# Patient Record
Sex: Male | Born: 1990 | Race: Black or African American | Hispanic: No | Marital: Single | Smoking: Never smoker
Health system: Southern US, Community
[De-identification: ages and names within clinical notes are randomized; demographics above are authoritative.]

## PROBLEM LIST (undated history)

## (undated) HISTORY — PX: NO PAST SURGERIES: SHX2092

---
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
## 2011-09-15 ENCOUNTER — Emergency Department: Payer: Self-pay | Admitting: Emergency Medicine

## 2014-09-22 ENCOUNTER — Emergency Department
Admission: EM | Admit: 2014-09-22 | Discharge: 2014-09-22 | Disposition: A | Discharge status: Home / Self Care | Payer: Self-pay | Attending: Emergency Medicine | Admitting: Emergency Medicine

## 2014-09-22 ENCOUNTER — Encounter: Payer: Self-pay | Admitting: Emergency Medicine

## 2014-09-22 DIAGNOSIS — J351 Hypertrophy of tonsils: Secondary | ICD-10-CM

## 2014-09-22 DIAGNOSIS — J358 Other chronic diseases of tonsils and adenoids: Secondary | ICD-10-CM | POA: Insufficient documentation

## 2014-09-22 LAB — POCT RAPID STREP A: STREPTOCOCCUS, GROUP A SCREEN (DIRECT): NEGATIVE

## 2014-09-22 NOTE — ED Notes (Signed)
Pt reports sore throat x3 days, reports swelling to tonsils, reports difficulty talking; airway intact.

## 2014-09-22 NOTE — ED Provider Notes (Signed)
[facility] Emergency Department Provider Note  ____________________________________________  Time seen: On arrival  I have reviewed the triage vital signs and the nursing notes.   HISTORY  Chief Complaint Sore Throat    HPI Eduardo Edwards is a 24 y.o. male who presents with a discomfort in the back of his throat that occurs intermittently over the last couple of months. He denies a history of acid reflux. He denies a history of difficulty swallowing or difficult breathing. He reports it feels like something is pressing on the back of his throat occasionally and he has to clear his throat. Mother reports patient's family have all had had her tonsils removed and she believes that is what causing his symptoms. He denies a sore throat.    History reviewed. No pertinent past medical history.  There are no active problems to display for this patient.   History reviewed. No pertinent past surgical history.  No current outpatient prescriptions on file.  Allergies Review of patient's allergies indicates no known allergies.  No family history on file.  Social History History  Substance Use Topics  . Smoking status: Never Smoker   . Smokeless tobacco: Not on file  . Alcohol Use: No    Review of Systems  Constitutional: Negative for fever. Eyes: Negative for visual changes. ENT: Negative for sore throat   Genitourinary: Negative for dysuria. Musculoskeletal: Negative for back pain. Skin: Negative for rash. Neurological: Negative for headaches or focal weakness   ____________________________________________   PHYSICAL EXAM:  VITAL SIGNS: ED Triage Vitals  Enc Vitals Group     BP 09/22/14 0958 134/84 mmHg     Pulse Rate 09/22/14 0958 68     Resp 09/22/14 0958 16     Temp 09/22/14 0958 98.4 F (36.9 C)     Temp Source 09/22/14 0958 Oral     SpO2 09/22/14 0958 97 %     Weight 09/22/14 0958 220 lb (99.791 kg)     Height 09/22/14 0958 5\' 10"   (1.778 m)     Head Cir --      Peak Flow --      Pain Score 09/22/14 0958 10     Pain Loc --      Pain Edu? --      Excl. in GC? --      Constitutional: Alert and oriented. Well appearing and in no distress. Eyes: Conjunctivae are normal.  ENT   Head: Normocephalic and atraumatic.   Mouth/Throat: Mucous membranes are moist. Patient does have large tonsils bilaterally but no evidence of infection. Uvula is normal. No erythema, no stridor Cardiovascular: Normal rate, regular rhythm.  Respiratory: Normal respiratory effort without tachypnea nor retractions.  Gastrointestinal: Soft and non-tender in all quadrants. No distention. There is no CVA tenderness. Musculoskeletal: Nontender with normal range of motion in all extremities. Neurologic:  Normal speech and language. No gross focal neurologic deficits are appreciated. Skin:  Skin is warm, dry and intact. No rash noted. Psychiatric: Mood and affect are normal. Patient exhibits appropriate insight and judgment.  ____________________________________________    LABS (pertinent positives/negatives)  Labs Reviewed  CULTURE, GROUP A STREP ([facility] ONLY)  POCT RAPID STREP A    ____________________________________________     ____________________________________________    RADIOLOGY I have personally reviewed any xrays that were ordered on this patient: None  ____________________________________________   PROCEDURES  Procedure(s) performed: none   ____________________________________________   INITIAL IMPRESSION / ASSESSMENT AND PLAN / ED COURSE  Pertinent labs &  imaging results that were available during my care of the patient were reviewed by me and considered in my medical decision making (see chart for details).  Unclear cause of patient's symptoms, suspect enlarged tonsils versus gastric reflux. Refer to ENT for further evaluation. Patient no acute  distress  ____________________________________________   FINAL CLINICAL IMPRESSION(S) / ED DIAGNOSES  Final diagnoses:  Swollen tonsil     Jene Every, MD 09/22/14 1122

## 2014-09-24 LAB — CULTURE, GROUP A STREP (THRC)

## 2015-08-30 ENCOUNTER — Emergency Department
Admission: EM | Admit: 2015-08-30 | Discharge: 2015-08-30 | Disposition: A | Discharge status: Home / Self Care | Payer: Self-pay | Attending: Emergency Medicine | Admitting: Emergency Medicine

## 2015-08-30 ENCOUNTER — Encounter: Payer: Self-pay | Admitting: Emergency Medicine

## 2015-08-30 DIAGNOSIS — L237 Allergic contact dermatitis due to plants, except food: Secondary | ICD-10-CM | POA: Insufficient documentation

## 2015-08-30 MED ORDER — PREDNISONE 10 MG (21) PO TBPK: ORAL_TABLET | ORAL | Status: DC

## 2015-08-30 MED ORDER — HYDROXYZINE HCL 25 MG PO TABS: 25.0000 mg | ORAL_TABLET | Freq: Three times a day (TID) | ORAL | Status: DC | PRN

## 2015-08-30 NOTE — Discharge Instructions (Signed)
Contact Dermatitis Dermatitis is redness, soreness, and swelling (inflammation) of the skin. Contact dermatitis is a reaction to certain substances that touch the skin. There are two types of contact dermatitis:   Irritant contact dermatitis. This type is caused by something that irritates your skin, such as dry hands from washing them too much. This type does not require previous exposure to the substance for a reaction to occur. This type is more common.  Allergic contact dermatitis. This type is caused by a substance that you are allergic to, such as a nickel allergy or poison ivy. This type only occurs if you have been exposed to the substance (allergen) before. Upon a repeat exposure, your body reacts to the substance. This type is less common. CAUSES  Many different substances can cause contact dermatitis. Irritant contact dermatitis is most commonly caused by exposure to:   Makeup.   Soaps.   Detergents.   Bleaches.   Acids.   Metal salts, such as nickel.  Allergic contact dermatitis is most commonly caused by exposure to:   Poisonous plants.   Chemicals.   Jewelry.   Latex.   Medicines.   Preservatives in products, such as clothing.  RISK FACTORS This condition is more likely to develop in:   People who have jobs that expose them to irritants or allergens.  People who have certain medical conditions, such as asthma or eczema.  SYMPTOMS  Symptoms of this condition may occur anywhere on your body where the irritant has touched you or is touched by you. Symptoms include:  Dryness or flaking.   Redness.   Cracks.   Itching.   Pain or a burning feeling.   Blisters.  Drainage of small amounts of blood or clear fluid from skin cracks. With allergic contact dermatitis, there may also be swelling in areas such as the eyelids, mouth, or genitals.  DIAGNOSIS  This condition is diagnosed with a medical history and physical exam. A patch skin test  may be performed to help determine the cause. If the condition is related to your job, you may need to see an occupational medicine specialist. TREATMENT Treatment for this condition includes figuring out what caused the reaction and protecting your skin from further contact. Treatment may also include:   Steroid creams or ointments. Oral steroid medicines may be needed in more severe cases.  Antibiotics or antibacterial ointments, if a skin infection is present.  Antihistamine lotion or an antihistamine taken by mouth to ease itching.  A bandage (dressing). HOME CARE INSTRUCTIONS Skin Care  Moisturize your skin as needed.   Apply cool compresses to the affected areas.  Try taking a bath with:  Epsom salts. Follow the instructions on the packaging. You can get these at your local pharmacy or grocery store.  Baking soda. Pour a small amount into the bath as directed by your health care provider.  Colloidal oatmeal. Follow the instructions on the packaging. You can get this at your local pharmacy or grocery store.  Try applying baking soda paste to your skin. Stir water into baking soda until it reaches a paste-like consistency.  Do not scratch your skin.  Bathe less frequently, such as every other day.  Bathe in lukewarm water. Avoid using hot water. Medicines  Take or apply over-the-counter and prescription medicines only as told by your health care provider.   If you were prescribed an antibiotic medicine, take or apply your antibiotic as told by your health care provider. Do not stop using the   antibiotic even if your condition starts to improve. General Instructions  Keep all follow-up visits as told by your health care provider. This is important.  Avoid the substance that caused your reaction. If you do not know what caused it, keep a journal to try to track what caused it. Write down:  What you eat.  What cosmetic products you use.  What you drink.  What  you wear in the affected area. This includes jewelry.  If you were given a dressing, take care of it as told by your health care provider. This includes when to change and remove it. SEEK MEDICAL CARE IF:   Your condition does not improve with treatment.  Your condition gets worse.  You have signs of infection such as swelling, tenderness, redness, soreness, or warmth in the affected area.  You have a fever.  You have new symptoms. SEEK IMMEDIATE MEDICAL CARE IF:   You have a severe headache, neck pain, or neck stiffness.  You vomit.  You feel very sleepy.  You notice red streaks coming from the affected area.  Your bone or joint underneath the affected area becomes painful after the skin has healed.  The affected area turns darker.  You have difficulty breathing.   This information is not intended to replace advice given to you by your health care provider. Make sure you discuss any questions you have with your health care provider.   Document Released: 02/22/2000 Document Revised: 11/15/2014 Document Reviewed: 07/12/2014 Elsevier Interactive Patient Education 2016 [employer]  

## 2015-08-30 NOTE — ED Notes (Signed)
States he was helping clean out in yard last Sunday  Now has poison oak to arms and face

## 2015-08-30 NOTE — ED Provider Notes (Signed)
[facility] Emergency Department Provider Note  ____________________________________________  Time seen: Approximately 3:19 PM  I have reviewed the triage vital signs and the nursing notes.   HISTORY  Chief Complaint Poison Ivy   HPI Eduardo Edwards is a 25 y.o. male who presents to the emergency department for evaluation of a rash. While helping his dad cut some trees and work in the yard last Sunday, he feels he may have come into contact with poison ivy/oak. He awakened on Monday with itchy, watery rash that has now spread to his face and both arms. No relief with OTC topical medications.  History reviewed. No pertinent past medical history.  There are no active problems to display for this patient.   History reviewed. No pertinent past surgical history.  Current Outpatient Rx  Name  Route  Sig  Dispense  Refill  . hydrOXYzine (ATARAX/VISTARIL) 25 MG tablet   Oral   Take 1 tablet (25 mg total) by mouth 3 (three) times daily as needed.   30 tablet   0   . predniSONE (STERAPRED UNI-PAK 21 TAB) 10 MG (21) TBPK tablet      Take 6 tablets on day 1 Take 5 tablets on day 2 Take 4 tablets on day 3 Take 3 tablets on day 4 Take 2 tablets on day 5 Take 1 tablet on day 6   21 tablet   0     Allergies Review of patient's allergies indicates no known allergies.  No family history on file.  Social History Social History  Substance Use Topics  . Smoking status: Never Smoker   . Smokeless tobacco: None  . Alcohol Use: No    Review of Systems  Constitutional: Negative for fever/chills Respiratory: Negative for shortness of breath. Musculoskeletal: Negative for pain. Skin: Positive for rash. Neurological: Negative for headaches, focal weakness or numbness. ____________________________________________   PHYSICAL EXAM:  VITAL SIGNS: ED Triage Vitals  Enc Vitals Group     BP 08/30/15 1414 127/80 mmHg     Pulse Rate 08/30/15 1414 81     Resp  08/30/15 1414 17     Temp 08/30/15 1414 98.9 F (37.2 C)     Temp src --      SpO2 08/30/15 1414 99 %     Weight 08/30/15 1414 240 lb (108.863 kg)     Height 08/30/15 1414 5\' 10"  (1.778 m)     Head Cir --      Peak Flow --      Pain Score --      Pain Loc --      Pain Edu? --      Excl. in GC? --      Constitutional: Alert and oriented. Well appearing and in no acute distress. Eyes: Conjunctivae are normal. EOMI. Nose: No congestion/rhinnorhea. Mouth/Throat: Mucous membranes are moist.   Neck: No stridor. Lymphatic: No cervical lymphadenopathy. Cardiovascular: Good peripheral circulation. Respiratory: Normal respiratory effort.  No retractions. Lungs clear to auscultation. Musculoskeletal: FROM throughout. Neurologic:  Normal speech and language. No gross focal neurologic deficits are appreciated. Skin:  Linear, vesicular rash consistent with poison ivy exposure on bilateral forearms and forehead.  ____________________________________________   LABS (all labs ordered are listed, but only abnormal results are displayed)  Labs Reviewed - No data to display ____________________________________________  EKG   ____________________________________________  RADIOLOGY   ____________________________________________   PROCEDURES  Procedure(s) performed:  ____________________________________________   INITIAL IMPRESSION / ASSESSMENT AND PLAN / ED COURSE  Pertinent  labs & imaging results that were available during my care of the patient were reviewed by me and considered in my medical decision making (see chart for details).  He will be advised to take the prednisone taper until finished and the hydroxyzine as prescribed.  He was advised to follow up with PCP of his choice as needed.  He was also advised to return to the emergency department for symptoms that change or worsen if unable to schedule an  appointment.  ____________________________________________   FINAL CLINICAL IMPRESSION(S) / ED DIAGNOSES  Final diagnoses:  Contact dermatitis due to poison ivy    Discharge Medication List as of 08/30/2015  2:40 PM    START taking these medications   Details  hydrOXYzine (ATARAX/VISTARIL) 25 MG tablet Take 1 tablet (25 mg total) by mouth 3 (three) times daily as needed., Starting 08/30/2015, Until Discontinued, Print    predniSONE (STERAPRED UNI-PAK 21 TAB) 10 MG (21) TBPK tablet Take 6 tablets on day 1 Take 5 tablets on day 2 Take 4 tablets on day 3 Take 3 tablets on day 4 Take 2 tablets on day 5 Take 1 tablet on day 6, Print         Chinita PesterCari B Naftuli Dalsanto, FNP 08/30/15 1522  Minna AntisKevin Paduchowski, MD 08/30/15 2006

## 2016-02-21 ENCOUNTER — Emergency Department: Payer: Self-pay

## 2016-02-21 ENCOUNTER — Encounter: Payer: Self-pay | Admitting: Emergency Medicine

## 2016-02-21 ENCOUNTER — Emergency Department
Admission: EM | Admit: 2016-02-21 | Discharge: 2016-02-21 | Disposition: A | Discharge status: Home / Self Care | Payer: Self-pay | Attending: Emergency Medicine | Admitting: Emergency Medicine

## 2016-02-21 DIAGNOSIS — Z79899 Other long term (current) drug therapy: Secondary | ICD-10-CM | POA: Insufficient documentation

## 2016-02-21 DIAGNOSIS — R42 Dizziness and giddiness: Secondary | ICD-10-CM

## 2016-02-21 LAB — URINALYSIS, COMPLETE (UACMP) WITH MICROSCOPIC
Bacteria, UA: NONE SEEN
Bilirubin Urine: NEGATIVE
Glucose, UA: NEGATIVE mg/dL
Ketones, ur: NEGATIVE mg/dL
Leukocytes, UA: NEGATIVE
Nitrite: NEGATIVE
PROTEIN: NEGATIVE mg/dL
SQUAMOUS EPITHELIAL / LPF: NONE SEEN
Specific Gravity, Urine: 1.028 (ref 1.005–1.030)
pH: 5 (ref 5.0–8.0)

## 2016-02-21 LAB — BASIC METABOLIC PANEL
Anion gap: 6 (ref 5–15)
BUN: 13 mg/dL (ref 6–20)
CO2: 28 mmol/L (ref 22–32)
CREATININE: 1.08 mg/dL (ref 0.61–1.24)
Calcium: 9.3 mg/dL (ref 8.9–10.3)
Chloride: 105 mmol/L (ref 101–111)
GFR calc Af Amer: >60 mL/min (ref >60)
GLUCOSE: 123 mg/dL — AB (ref 65–99)
POTASSIUM: 3.6 mmol/L (ref 3.5–5.1)
Sodium: 139 mmol/L (ref 135–145)

## 2016-02-21 LAB — CBC
HCT: 39.1 % — ABNORMAL LOW (ref 40.0–52.0)
Hemoglobin: 13.8 g/dL (ref 13.0–18.0)
MCH: 29.6 pg (ref 26.0–34.0)
MCHC: 35.2 g/dL (ref 32.0–36.0)
MCV: 84.1 fL (ref 80.0–100.0)
PLATELETS: 310 10*3/uL (ref 150–440)
RBC: 4.65 MIL/uL (ref 4.40–5.90)
RDW: 14.1 % (ref 11.5–14.5)
WBC: 9.1 10*3/uL (ref 3.8–10.6)

## 2016-02-21 LAB — TROPONIN I

## 2016-02-21 MED ORDER — MECLIZINE HCL 32 MG PO TABS: 32.0000 mg | ORAL_TABLET | Freq: Three times a day (TID) | ORAL | 0 refills | Status: DC | PRN

## 2016-02-21 NOTE — ED Provider Notes (Signed)
[facility] Emergency Department Provider Note    First MD Initiated Contact with Patient 02/21/16 0423     (approximate)  I have reviewed the triage vital signs and the nursing notes.   HISTORY  Chief Complaint Dizziness   HPI Eduardo Edwards is a 25 y.o. male presents with intermittent dizziness 3 days with no associated symptoms. Patient denies any chest pain no shortness of breath or  headache. Patient denies any aggravating or alleviating factors for episodes of dizziness. Patient denies any recent illness no congestion or cough. Patient denies any nausea vomiting or diarrhea recently.   Past medical history None There are no active problems to display for this patient.   Past surgical history None  Prior to Admission medications   Medication Sig Start Date End Date Taking? Authorizing Provider  hydrOXYzine (ATARAX/VISTARIL) 25 MG tablet Take 1 tablet (25 mg total) by mouth 3 (three) times daily as needed. 08/30/15   Chinita Pesterari B Triplett, FNP  meclizine (ANTIVERT) 32 MG tablet Take 1 tablet (32 mg total) by mouth 3 (three) times daily as needed. 02/21/16   Darci Currentandolph N Quiros, MD  predniSONE (STERAPRED UNI-PAK 21 TAB) 10 MG (21) TBPK tablet Take 6 tablets on day 1 Take 5 tablets on day 2 Take 4 tablets on day 3 Take 3 tablets on day 4 Take 2 tablets on day 5 Take 1 tablet on day 6 08/30/15   Chinita Pesterari B Triplett, FNP    Allergies Patient has no known allergies.  No family history on file.  Social History Social History  Substance Use Topics  . Smoking status: Never Smoker  . Smokeless tobacco: Never Used  . Alcohol use No    Review of Systems Constitutional: No fever/chills Eyes: No visual changes. ENT: No sore throat. Cardiovascular: Denies chest pain. Respiratory: Denies shortness of breath. Gastrointestinal: No abdominal pain.  No nausea, no vomiting.  No diarrhea.  No constipation. Genitourinary: Negative for dysuria. Musculoskeletal:  Negative for back pain. Skin: Negative for rash. Neurological: Negative for headaches, focal weakness or numbness.Positive for dizziness  10-point ROS otherwise negative.  ____________________________________________   PHYSICAL EXAM:  VITAL SIGNS: ED Triage Vitals [02/21/16 0104]  Enc Vitals Group     BP 120/75     Pulse Rate 74     Resp 18     Temp 98 F (36.7 C)     Temp Source Oral     SpO2 97 %     Weight 230 lb (104.3 kg)     Height 5\' 10"  (1.778 m)     Head Circumference      Peak Flow      Pain Score      Pain Loc      Pain Edu?      Excl. in GC?     Constitutional: Alert and oriented. Well appearing and in no acute distress. Eyes: Conjunctivae are normal. PERRL. EOMI. Head: Atraumatic. Ears:  Healthy appearing ear canals and TMs bilaterally Nose: No congestion/rhinnorhea. Mouth/Throat: Mucous membranes are moist.  Oropharynx non-erythematous. Neck: No stridor.  No meningeal signs.  Cardiovascular: Normal rate, regular rhythm. Good peripheral circulation. Grossly normal heart sounds. Respiratory: Normal respiratory effort.  No retractions. Lungs CTAB. Gastrointestinal: Soft and nontender. No distention.  Musculoskeletal: No lower extremity tenderness nor edema. No gross deformities of extremities. Neurologic:  Normal speech and language. No gross focal neurologic deficits are appreciated.  Skin:  Skin is warm, dry and intact. No rash noted. Psychiatric: Mood and  affect are normal. Speech and behavior are normal.  ____________________________________________   LABS (all labs ordered are listed, but only abnormal results are displayed)  Labs Reviewed  BASIC METABOLIC PANEL - Abnormal; Notable for the following:       Result Value   Glucose, Bld 123 (*)    All other components within normal limits  CBC - Abnormal; Notable for the following:    HCT 39.1 (*)    All other components within normal limits  URINALYSIS, COMPLETE (UACMP) WITH MICROSCOPIC -  Abnormal; Notable for the following:    Color, Urine YELLOW (*)    APPearance CLEAR (*)    Hgb urine dipstick MODERATE (*)    All other components within normal limits  TROPONIN I   ____________________________________________  EKG  ED ECG REPORT I, [place] N Age, the attending physician, personally viewed and interpreted this ECG.   Date: 02/21/2016  EKG Time: 1:08 AM  Rate: 76  Rhythm: Normal sinus rhythm  Axis: Normal  Intervals: Normal  ST&T Change: None  ____________________________________________  RADIOLOGY I, [place] N Aho, personally viewed and evaluated these images (plain radiographs) as part of my medical decision making, as well as reviewing the written report by the radiologist.  Ct Head Wo Contrast  Result Date: 02/21/2016 CLINICAL DATA:  25 year old male with dizziness. EXAM: CT HEAD WITHOUT CONTRAST TECHNIQUE: Contiguous axial images were obtained from the base of the skull through the vertex without intravenous contrast. COMPARISON:  None. FINDINGS: Brain: No evidence of acute infarction, hemorrhage, hydrocephalus, extra-axial collection or mass lesion/mass effect. Vascular: No hyperdense vessel or unexpected calcification. Skull: Normal. Negative for fracture or focal lesion. Sinuses/Orbits: No acute finding. Other: None. IMPRESSION: No acute intracranial pathology. Electronically Signed   By: Elgie CollardArash  Radparvar M.D.   On: 02/21/2016 05:20     Procedures    INITIAL IMPRESSION / ASSESSMENT AND PLAN / ED COURSE  Pertinent labs & imaging results that were available during my care of the patient were reviewed by me and considered in my medical decision making (see chart for details).     Clinical Course     ____________________________________________  FINAL CLINICAL IMPRESSION(S) / ED DIAGNOSES  Final diagnoses:  Vertigo     MEDICATIONS GIVEN DURING THIS VISIT:  Medications - No data to display   NEW OUTPATIENT MEDICATIONS STARTED  DURING THIS VISIT:  New Prescriptions   MECLIZINE (ANTIVERT) 32 MG TABLET    Take 1 tablet (32 mg total) by mouth 3 (three) times daily as needed.    Modified Medications   No medications on file    Discontinued Medications   No medications on file     Note:  This document was prepared using Dragon voice recognition software and may include unintentional dictation errors.    Darci Currentandolph N Bagby, MD 02/21/16 [phone]

## 2016-02-21 NOTE — ED Triage Notes (Signed)
Patient ambulatory to triage with steady gait, without difficulty or distress noted; pt reports dizziness last couple days with no accomp symptoms; denies any recent illness

## 2016-07-09 ENCOUNTER — Encounter: Payer: Self-pay | Admitting: Emergency Medicine

## 2016-07-09 ENCOUNTER — Emergency Department
Admission: EM | Admit: 2016-07-09 | Discharge: 2016-07-09 | Disposition: A | Discharge status: Home / Self Care | Payer: Self-pay | Attending: Emergency Medicine | Admitting: Emergency Medicine

## 2016-07-09 DIAGNOSIS — R42 Dizziness and giddiness: Secondary | ICD-10-CM | POA: Insufficient documentation

## 2016-07-09 DIAGNOSIS — Z79899 Other long term (current) drug therapy: Secondary | ICD-10-CM | POA: Insufficient documentation

## 2016-07-09 LAB — CBC
HCT: 39.2 % — ABNORMAL LOW (ref 40.0–52.0)
Hemoglobin: 13.5 g/dL (ref 13.0–18.0)
MCH: 28.8 pg (ref 26.0–34.0)
MCHC: 34.3 g/dL (ref 32.0–36.0)
MCV: 83.8 fL (ref 80.0–100.0)
PLATELETS: 311 10*3/uL (ref 150–440)
RBC: 4.67 MIL/uL (ref 4.40–5.90)
RDW: 14.5 % (ref 11.5–14.5)
WBC: 7 10*3/uL (ref 3.8–10.6)

## 2016-07-09 LAB — URINALYSIS, COMPLETE (UACMP) WITH MICROSCOPIC
Bilirubin Urine: NEGATIVE
Glucose, UA: NEGATIVE mg/dL
Hgb urine dipstick: NEGATIVE
KETONES UR: NEGATIVE mg/dL
Leukocytes, UA: NEGATIVE
Nitrite: NEGATIVE
Protein, ur: NEGATIVE mg/dL
Specific Gravity, Urine: 1.024 (ref 1.005–1.030)
WBC UA: NONE SEEN WBC/hpf (ref 0–5)
pH: 5 (ref 5.0–8.0)

## 2016-07-09 LAB — BASIC METABOLIC PANEL
Anion gap: 7 (ref 5–15)
BUN: 15 mg/dL (ref 6–20)
CALCIUM: 9.1 mg/dL (ref 8.9–10.3)
CO2: 25 mmol/L (ref 22–32)
CREATININE: 1.1 mg/dL (ref 0.61–1.24)
Chloride: 109 mmol/L (ref 101–111)
GFR calc non Af Amer: >60 mL/min (ref >60)
Glucose, Bld: 101 mg/dL — ABNORMAL HIGH (ref 65–99)
Potassium: 3.9 mmol/L (ref 3.5–5.1)
Sodium: 141 mmol/L (ref 135–145)

## 2016-07-09 LAB — GLUCOSE, CAPILLARY: GLUCOSE-CAPILLARY: 97 mg/dL (ref 65–99)

## 2016-07-09 LAB — TROPONIN I

## 2016-07-09 MED ORDER — MECLIZINE HCL 25 MG PO TABS: 25.0000 mg | ORAL_TABLET | Freq: Three times a day (TID) | ORAL | 0 refills | Status: DC | PRN

## 2016-07-09 NOTE — ED Provider Notes (Signed)
[facility] Emergency Department Provider Note   ____________________________________________   I have reviewed the triage vital signs and the nursing notes.   HISTORY  Chief Complaint Dizziness   History limited by: Not Limited   HPI Eduardo Edwards is a 26 y.o. male who presents to the emergency department today because of concerns for intermittent episodes of feeling dizzy and lightheaded. He states this is been going on for months. The symptoms will come and go. He has not been able to determine any pattern to them. Sometimes a last for days sometimes weeks and then go away. It makes him feel lightheaded. He denies any palpitations or chest pain during these episodes. No headaches no shortness of breath. Patient was seen in the emergency department roughly 5 months ago for vertigo had a negative head CT at that time.   History reviewed. No pertinent past medical history.  There are no active problems to display for this patient.   History reviewed. No pertinent surgical history.  Prior to Admission medications   Medication Sig Start Date End Date Taking? Authorizing Provider  hydrOXYzine (ATARAX/VISTARIL) 25 MG tablet Take 1 tablet (25 mg total) by mouth 3 (three) times daily as needed. 08/30/15   Chinita Pester, FNP  meclizine (ANTIVERT) 32 MG tablet Take 1 tablet (32 mg total) by mouth 3 (three) times daily as needed. 02/21/16   Darci Current, MD  predniSONE (STERAPRED UNI-PAK 21 TAB) 10 MG (21) TBPK tablet Take 6 tablets on day 1 Take 5 tablets on day 2 Take 4 tablets on day 3 Take 3 tablets on day 4 Take 2 tablets on day 5 Take 1 tablet on day 6 08/30/15   Chinita Pester, FNP    Allergies Patient has no known allergies.  No family history on file.  Social History Social History  Substance Use Topics  . Smoking status: Never Smoker  . Smokeless tobacco: Never Used  . Alcohol use No    Review of Systems Constitutional: No  fever/chills Eyes: No visual changes. ENT: No sore throat. Cardiovascular: Denies chest pain. Respiratory: Denies shortness of breath. Gastrointestinal: No abdominal pain.  No nausea, no vomiting.  No diarrhea.   Genitourinary: Negative for dysuria. Musculoskeletal: Negative for back pain. Skin: Negative for rash. Neurological: Positive for lightheadedness.   ____________________________________________   PHYSICAL EXAM:  VITAL SIGNS: ED Triage Vitals  Enc Vitals Group     BP 07/09/16 1304 128/87     Pulse Rate 07/09/16 1304 97     Resp 07/09/16 1304 18     Temp 07/09/16 1304 98.7 F (37.1 C)     Temp Source 07/09/16 1304 Oral     SpO2 07/09/16 1304 97 %     Weight 07/09/16 1305 230 lb (104.3 kg)     Height 07/09/16 1305  (1.778 m)   Constitutional: Alert and oriented. Well appearing and in no distress. Eyes: Conjunctivae are normal. Normal extraocular movements. ENT   Head: Normocephalic and atraumatic.   Nose: No congestion/rhinnorhea.   Mouth/Throat: Mucous membranes are moist.   Neck: No stridor. Hematological/Lymphatic/Immunilogical: No cervical lymphadenopathy. Cardiovascular: Normal rate, regular rhythm.  No murmurs, rubs, or gallops.  Respiratory: Normal respiratory effort without tachypnea nor retractions. Breath sounds are clear and equal bilaterally. No wheezes/rales/rhonchi. Gastrointestinal: Soft and non tender. No rebound. No guarding.  Genitourinary: Deferred Musculoskeletal: Normal range of motion in all extremities. No lower extremity edema. Neurologic:  Normal speech and language. PERRL. EOMI. Tongue midline. Face  symmetric. Finger to nose normal. Heel to toe walk normal. Romberg normal. Heel to shin normal. No gross focal neurologic deficits are appreciated.  Skin:  Skin is warm, dry and intact. No rash noted. Psychiatric: Mood and affect are normal. Speech and behavior are normal. Patient exhibits appropriate insight and  judgment.  ____________________________________________    LABS (pertinent positives/negatives)  Labs Reviewed  BASIC METABOLIC PANEL - Abnormal; Notable for the following:       Result Value   Glucose, Bld 101 (*)    All other components within normal limits  CBC - Abnormal; Notable for the following:    HCT 39.2 (*)    All other components within normal limits  URINALYSIS, COMPLETE (UACMP) WITH MICROSCOPIC - Abnormal; Notable for the following:    Color, Urine YELLOW (*)    APPearance CLEAR (*)    Bacteria, UA RARE (*)    Squamous Epithelial / LPF 0-5 (*)    All other components within normal limits  TROPONIN I  GLUCOSE, CAPILLARY  CBG MONITORING, ED     ____________________________________________   EKG  I, Phineas Semen, attending physician, personally viewed and interpreted this EKG  EKG Time: 1305 Rate: 86 Rhythm: normal sinus rhythm Axis: normal Intervals: qtc 418 QRS: narrow ST changes: no st elevation Impression: normal ekg   ____________________________________________    RADIOLOGY  None  ____________________________________________   PROCEDURES  Procedures  ____________________________________________   INITIAL IMPRESSION / ASSESSMENT AND PLAN / ED COURSE  Pertinent labs & imaging results that were available during my care of the patient were reviewed by me and considered in my medical decision making (see chart for details).  Patient presented to the emergency department today because of concerns for intermittent episodes of lightheadedness. On exam no concerning cerebellar signs. Additionally had a negative head CT done 5 months ago. Patient has had these symptoms for a number of months. At this point did not feel that any further emergent imaging is necessary. Will plan on however on giving patient primary care as well as neurology follow-up. Discussed return precautions with  patient.  ____________________________________________   FINAL CLINICAL IMPRESSION(S) / ED DIAGNOSES  Final diagnoses:  Dizziness     Note: This dictation was prepared with Dragon dictation. Any transcriptional errors that result from this process are unintentional     Phineas Semen, MD 07/09/16 1517

## 2016-07-09 NOTE — Discharge Instructions (Signed)
Please seek medical attention for any high fevers, chest pain, shortness of breath, change in behavior, persistent vomiting, bloody stool or any other new or concerning symptoms.  

## 2016-07-09 NOTE — ED Triage Notes (Addendum)
Pt in via POV with complaints of intermittent dizziness x "a couple months."  Pt with hx of vertigo.  Pt denies any other complaints, vitals WDL, NAD noted at this time.

## 2018-06-05 ENCOUNTER — Encounter: Payer: Self-pay | Admitting: Emergency Medicine

## 2018-06-05 ENCOUNTER — Other Ambulatory Visit: Payer: Self-pay

## 2018-06-05 DIAGNOSIS — Z5321 Procedure and treatment not carried out due to patient leaving prior to being seen by health care provider: Secondary | ICD-10-CM | POA: Insufficient documentation

## 2018-06-05 DIAGNOSIS — R630 Anorexia: Secondary | ICD-10-CM | POA: Insufficient documentation

## 2018-06-05 NOTE — ED Triage Notes (Signed)
Pt arrives POV to triage with c/o loss of appetite today. Pt is in NAD.

## 2018-06-05 NOTE — ED Notes (Signed)
Pt here for a "checkup"; reports feeling "woozy";

## 2018-06-05 NOTE — ED Notes (Signed)
Pt seen walking out the front door on cell phone as if trying to find a signal for a call; ambulatory with steady gait

## 2018-06-05 NOTE — ED Notes (Signed)
Rounding in waiting room, pt not found; not out front of ED either;

## 2018-06-06 ENCOUNTER — Emergency Department
Admission: EM | Admit: 2018-06-06 | Discharge: 2018-06-06 | Disposition: A | Discharge status: Home / Self Care | Payer: Self-pay | Attending: Emergency Medicine | Admitting: Emergency Medicine

## 2018-07-03 ENCOUNTER — Emergency Department
Admission: EM | Admit: 2018-07-03 | Discharge: 2018-07-03 | Disposition: A | Discharge status: Home / Self Care | Payer: Self-pay | Attending: Emergency Medicine | Admitting: Emergency Medicine

## 2018-07-03 ENCOUNTER — Emergency Department: Payer: Self-pay

## 2018-07-03 ENCOUNTER — Other Ambulatory Visit: Payer: Self-pay

## 2018-07-03 DIAGNOSIS — Z79899 Other long term (current) drug therapy: Secondary | ICD-10-CM | POA: Insufficient documentation

## 2018-07-03 DIAGNOSIS — F17200 Nicotine dependence, unspecified, uncomplicated: Secondary | ICD-10-CM | POA: Insufficient documentation

## 2018-07-03 DIAGNOSIS — R0602 Shortness of breath: Secondary | ICD-10-CM | POA: Insufficient documentation

## 2018-07-03 NOTE — ED Triage Notes (Addendum)
Pt states he is here for feeling fatigued and "like something is up with my lung" states he feels like he is able to take a deep breath but states he feels like episodes of these symptoms come on. A&O, ambulatory. No distress noted. Also c/o of loss of appetite. Denies cough or congestion. Denies SOB.

## 2018-07-03 NOTE — ED Provider Notes (Signed)
[facility] Emergency Department Provider Note   ____________________________________________    I have reviewed the triage vital signs and the nursing notes.   HISTORY  Chief Complaint Shortness of breath   HPI Eduardo Edwards is a 28 y.o. male who presents with complaints of "strange breathing "yesterday.  He reports that it felt "weird "when he took a deep breath.  Today he feels quite well and has no breathing complaints.  No fevers or chills.  No cough.  No recent travel.  No exposure to COVID positive patients.  No calf pain or swelling.  No nausea or vomiting or diaphoresis.  No chest pain.  No myalgias.  History reviewed. No pertinent past medical history.  There are no active problems to display for this patient.   History reviewed. No pertinent surgical history.  Prior to Admission medications   Medication Sig Start Date End Date Taking? Authorizing Provider  hydrOXYzine (ATARAX/VISTARIL) 25 MG tablet Take 1 tablet (25 mg total) by mouth 3 (three) times daily as needed. 08/30/15   Triplett, Rulon Eisenmengerari B, FNP  meclizine (ANTIVERT) 25 MG tablet Take 1 tablet (25 mg total) by mouth 3 (three) times daily as needed for dizziness. 07/09/16   Phineas SemenGoodman, Graydon, MD  meclizine (ANTIVERT) 32 MG tablet Take 1 tablet (32 mg total) by mouth 3 (three) times daily as needed. 02/21/16   Darci CurrentBrown, [place] N, MD  predniSONE (STERAPRED UNI-PAK 21 TAB) 10 MG (21) TBPK tablet Take 6 tablets on day 1 Take 5 tablets on day 2 Take 4 tablets on day 3 Take 3 tablets on day 4 Take 2 tablets on day 5 Take 1 tablet on day 6 08/30/15   Kem Boroughsriplett, Cari B, FNP     Allergies Patient has no known allergies.  History reviewed. No pertinent family history.  Social History Social History   Tobacco Use  . Smoking status: Current Every Day Smoker    Types: E-cigarettes  . Smokeless tobacco: Never Used  Substance Use Topics  . Alcohol use: No  . Drug use: No    Review of Systems   Constitutional: No fever/chills Eyes: No visual changes.  ENT: No sore throat. Cardiovascular: Denies chest pain. Respiratory: As above Gastrointestinal: No abdominal pain.    Genitourinary: Negative for dysuria. Musculoskeletal: Negative for back pain. Skin: Negative for rash. Neurological: Negative for headaches or weakness   ____________________________________________   PHYSICAL EXAM:  VITAL SIGNS: ED Triage Vitals [07/03/18 1436]  Enc Vitals Group     BP (!) 143/82     Pulse Rate 75     Resp 16     Temp 98.1 F (36.7 C)     Temp Source Oral     SpO2 98 %     Weight 113.4 kg (250 lb)     Height 1.778 m (5\' 10" )     Head Circumference      Peak Flow      Pain Score 0     Pain Loc      Pain Edu?      Excl. in [place]?     Constitutional: Alert and oriented.  Well-appearing Eyes: Conjunctivae are normal.   Nose: No congestion/rhinnorhea. Mouth/Throat: Mucous membranes are moist.    Cardiovascular: Normal rate, regular rhythm.   Respiratory: Normal respiratory effort.  No retractions.    Musculoskeletal: No lower extremity tenderness nor edema.  Warm and well perfused Neurologic:  Normal speech and language. No gross focal neurologic deficits are appreciated.  Skin:  Skin is warm, dry and intact. No rash noted. Psychiatric: Mood and affect are normal. Speech and behavior are normal.  ____________________________________________   LABS (all labs ordered are listed, but only abnormal results are displayed)  Labs Reviewed - No data to display ____________________________________________  EKG  None ____________________________________________  RADIOLOGY  X-ray ____________________________________________   PROCEDURES  Procedure(s) performed: No  Procedures   Critical Care performed: No ____________________________________________   INITIAL IMPRESSION / ASSESSMENT AND PLAN / ED COURSE  Pertinent labs & imaging results that were available  during my care of the patient were reviewed by me and considered in my medical decision making (see chart for details).  Patient well-appearing and in no acute distress.  He is essentially asymptomatic today.  Will obtain chest x-ray, no blood work necessary at this time.  Chest x-ray is unremarkable, patient is reassured, appropriate for discharge at this time   Eduardo Edwards was evaluated in Emergency Department on 07/03/2018 for the symptoms described in the history of present illness. He was evaluated in the context of the global COVID-19 pandemic, which necessitated consideration that the patient might be at risk for infection with the SARS-CoV-2 virus that causes COVID-19. Institutional protocols and algorithms that pertain to the evaluation of patients at risk for COVID-19 are in a state of rapid change based on information released by regulatory bodies including the CDC and federal and state organizations. These policies and algorithms were followed during the patient's care in the ED.     ____________________________________________   FINAL CLINICAL IMPRESSION(S) / ED DIAGNOSES  Final diagnoses:  SOB (shortness of breath)        Note:  This document was prepared using Dragon voice recognition software and may include unintentional dictation errors.   Jene Every, MD 07/03/18 [phone]

## 2018-11-28 ENCOUNTER — Other Ambulatory Visit: Payer: Self-pay

## 2018-11-28 ENCOUNTER — Emergency Department: Payer: Self-pay

## 2018-11-28 ENCOUNTER — Emergency Department
Admission: EM | Admit: 2018-11-28 | Discharge: 2018-11-29 | Disposition: A | Discharge status: Home / Self Care | Payer: Self-pay | Attending: Emergency Medicine | Admitting: Emergency Medicine

## 2018-11-28 DIAGNOSIS — Z87891 Personal history of nicotine dependence: Secondary | ICD-10-CM | POA: Insufficient documentation

## 2018-11-28 DIAGNOSIS — M7918 Myalgia, other site: Secondary | ICD-10-CM | POA: Insufficient documentation

## 2018-11-28 DIAGNOSIS — R0789 Other chest pain: Secondary | ICD-10-CM | POA: Insufficient documentation

## 2018-11-28 LAB — CBC
HCT: 39.4 % (ref 39.0–52.0)
Hemoglobin: 13.2 g/dL (ref 13.0–17.0)
MCH: 28.6 pg (ref 26.0–34.0)
MCHC: 33.5 g/dL (ref 30.0–36.0)
MCV: 85.5 fL (ref 80.0–100.0)
Platelets: 315 10*3/uL (ref 150–400)
RBC: 4.61 MIL/uL (ref 4.22–5.81)
RDW: 13.4 % (ref 11.5–15.5)
WBC: 9.4 10*3/uL (ref 4.0–10.5)
nRBC: 0 % (ref 0.0–0.2)

## 2018-11-28 MED ORDER — IBUPROFEN 600 MG PO TABS: ORAL_TABLET | ORAL | 0 refills | Status: DC

## 2018-11-28 MED ORDER — OMEPRAZOLE MAGNESIUM 20 MG PO TBEC: 20.0000 mg | DELAYED_RELEASE_TABLET | Freq: Every day | ORAL | 0 refills | Status: DC

## 2018-11-28 NOTE — ED Notes (Signed)
IV attempt x 2 with no success, pt to xray, will have 2nd nurse attempt on return,

## 2018-11-28 NOTE — ED Notes (Signed)
Patient transported to X-ray 

## 2018-11-28 NOTE — Discharge Instructions (Signed)

## 2018-11-28 NOTE — ED Provider Notes (Signed)
[facility] Emergency Department Provider Note  ____________________________________________   First MD Initiated Contact with Patient 11/28/18 2307     (approximate)  I have reviewed the triage vital signs and the nursing notes.   HISTORY  Chief Complaint Chest Pain    HPI Eduardo Edwards is a 28 y.o. male reports no chronic medical issues who presents for evaluation of about 4 days of discomfort to the middle and right side of his chest.  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  The pain is mild to moderate, worse with moving and stretching and lifting things, and better with rest.  He describes it as a dull ache that feels more sharp when he moves.  He has had no shortness of breath.  He denies contact of COVID-19 patients.  He denies fever/chills, sore throat, loss of smell and taste, nausea, vomiting, abdominal pain, and dysuria.  He has no history of high blood pressure, diabetes, high cholesterol, heart disease or peripheral vascular disease, and he has no first-degree relatives who have had heart attacks or coronary artery disease of which she is aware.  He does not use tobacco.         History reviewed. No pertinent past medical history.  There are no active problems to display for this patient.   History reviewed. No pertinent surgical history.  Prior to Admission medications   Medication Sig Start Date End Date Taking? Authorizing Provider  hydrOXYzine (ATARAX/VISTARIL) 25 MG tablet Take 1 tablet (25 mg total) by mouth 3 (three) times daily as needed. 08/30/15   Kem Boroughs B, FNP  ibuprofen (ADVIL) 600 MG tablet Take 1 tablet by mouth three times daily with meals 11/28/18   Loleta Rose, MD  meclizine (ANTIVERT) 25 MG tablet Take 1 tablet (25 mg total) by mouth 3 (three) times daily as needed for dizziness. 07/09/16   Phineas Semen, MD   meclizine (ANTIVERT) 32 MG tablet Take 1 tablet (32 mg total) by mouth 3 (three) times daily as needed. 02/21/16   Darci Current, MD  omeprazole (PRILOSEC OTC) 20 MG tablet Take 1 tablet (20 mg total) by mouth daily. 11/28/18 11/28/19  Loleta Rose, MD  predniSONE (STERAPRED UNI-PAK 21 TAB) 10 MG (21) TBPK tablet Take 6 tablets on day 1 Take 5 tablets on day 2 Take 4 tablets on day 3 Take 3 tablets on day 4 Take 2 tablets on day 5 Take 1 tablet on day 6 08/30/15   Kem Boroughs B, FNP    Allergies Patient has no known allergies.  No family history on file.  Social History Social History   Tobacco Use  . Smoking status: Former Smoker    Types: E-cigarettes  . Smokeless tobacco: Never Used  Substance Use Topics  . Alcohol use: No  . Drug use: No    Review of Systems Constitutional: No fever/chills Eyes: No visual changes. ENT: No sore throat. Cardiovascular: +chest pain. Respiratory: Denies shortness of breath. Gastrointestinal: No abdominal pain.  No nausea, no vomiting.  No diarrhea.  No constipation. Genitourinary: Negative for dysuria. Musculoskeletal: Negative for neck pain.  Negative for back pain. Integumentary: Negative for rash. Neurological: Negative for headaches, focal weakness or numbness.   ____________________________________________   PHYSICAL EXAM:  VITAL SIGNS: ED Triage Vitals [11/28/18 2234]  Enc Vitals Group     BP  Pulse      Resp      Temp      Temp src      SpO2      Weight 113.4 kg (250 lb)     Height 1.778 m (5\' 10" )     Head Circumference      Peak Flow      Pain Score 4     Pain Loc      Pain Edu?      Excl. in [place]?     Constitutional: Alert and oriented.  Well-appearing and in no distress. Eyes: Conjunctivae are normal.  Head: Atraumatic. Nose: No congestion/rhinnorhea. Mouth/Throat: Mucous membranes are moist. Neck: No stridor.  No meningeal signs.   Cardiovascular: Normal rate, regular rhythm. Good peripheral  circulation. Grossly normal heart sounds. Respiratory: Normal respiratory effort.  No retractions. Gastrointestinal: Soft and nontender. No distention.  Musculoskeletal: Highly reproducible mild tenderness to palpation of the sternum and right side of his chest consistent with musculoskeletal discomfort.  No lower extremity tenderness nor edema. No gross deformities of extremities. Neurologic:  Normal speech and language. No gross focal neurologic deficits are appreciated.  Skin:  Skin is warm, dry and intact. Psychiatric: Mood and affect are normal. Speech and behavior are normal.  ____________________________________________   LABS (all labs ordered are listed, but only abnormal results are displayed)  Labs Reviewed  BASIC METABOLIC PANEL - Abnormal; Notable for the following components:      Result Value   Glucose, Bld 108 (*)    Calcium 8.8 (*)    All other components within normal limits  CBC  TROPONIN I (HIGH SENSITIVITY)  TROPONIN I (HIGH SENSITIVITY)   ____________________________________________  EKG  ED ECG REPORT I, Hinda Kehr, the attending physician, personally viewed and interpreted this ECG.  Date: 11/28/2018 EKG Time: 22: 42 Rate: 75 Rhythm: normal sinus rhythm QRS Axis: normal Intervals: normal ST/T Wave abnormalities: normal Narrative Interpretation: no evidence of acute ischemia  ____________________________________________  RADIOLOGY I, Hinda Kehr, personally viewed and evaluated these images (plain radiographs) as part of my medical decision making, as well as reviewing the written report by the radiologist.  ED MD interpretation:  No evidence of acute abnormality  Official radiology report(s): Dg Chest 2 View  Result Date: 11/28/2018 CLINICAL DATA:  Chest pain EXAM: CHEST - 2 VIEW COMPARISON:  07/03/2018 FINDINGS: The heart size and mediastinal contours are within normal limits. Both lungs are clear. The visualized skeletal structures are  unremarkable. IMPRESSION: No active cardiopulmonary disease. Electronically Signed   By: Donavan Foil M.D.   On: 11/28/2018 23:05    ____________________________________________   PROCEDURES   Procedure(s) performed (including Critical Care):  Procedures   ____________________________________________   INITIAL IMPRESSION / MDM / [place] / ED COURSE  As part of my medical decision making, I reviewed the following data within the [facility] notes reviewed and incorporated, EKG interpreted , Old chart reviewed, Notes from prior ED visits and [place] Controlled Substance Database   Differential diagnosis includes, but is not limited to, musculoskeletal strain, costochondritis, ACS, PE, pneumonia, pneumothorax.  Patient is well-appearing and in no distress, conversant, with mild symptoms.  Normal vital signs, nonischemic EKG, normal chest x-ray.  Blood work is pending at this time but he is very low risk for ACS based on HEAR score and his history of present illness.  Will await labs and anticipate discharge with NSAIDs and outpatient follow up.      Clinical  Course as of Nov 29 23  Mon Nov 29, 2018  0025 Reassuring labs, troponin of 4, normal BMP and CBC.  Will discharge as per plan.   [CF]    Clinical Course User Index [CF] Loleta RoseForbach, Arien Benincasa, MD     ____________________________________________  FINAL CLINICAL IMPRESSION(S) / ED DIAGNOSES  Final diagnoses:  Chest wall pain  Musculoskeletal pain     MEDICATIONS GIVEN DURING THIS VISIT:  Medications - No data to display   ED Discharge Orders         Ordered    ibuprofen (ADVIL) 600 MG tablet     11/28/18 2337    omeprazole (PRILOSEC OTC) 20 MG tablet  Daily     11/28/18 2337          *Please note:  Eduardo Edwards was evaluated in Emergency Department on 11/29/2018 for the symptoms described in the history of present illness. He was evaluated in the context of the global COVID-19  pandemic, which necessitated consideration that the patient might be at risk for infection with the SARS-CoV-2 virus that causes COVID-19. Institutional protocols and algorithms that pertain to the evaluation of patients at risk for COVID-19 are in a state of rapid change based on information released by regulatory bodies including the CDC and federal and state organizations. These policies and algorithms were followed during the patient's care in the ED.  Some ED evaluations and interventions may be delayed as a result of limited staffing during the pandemic.*  Note:  This document was prepared using Dragon voice recognition software and may include unintentional dictation errors.   Loleta RoseForbach, Fausto Sampedro, MD 11/29/18 Moses Manners0025

## 2018-11-28 NOTE — ED Triage Notes (Signed)
Patient c/o medial chest pain X 4 days. Patient reports accompanying symptoms of SOB and lightheadednes. Intermittent nausea.

## 2018-11-29 LAB — BASIC METABOLIC PANEL
Anion gap: 6 (ref 5–15)
BUN: 13 mg/dL (ref 6–20)
CO2: 26 mmol/L (ref 22–32)
Calcium: 8.8 mg/dL — ABNORMAL LOW (ref 8.9–10.3)
Chloride: 106 mmol/L (ref 98–111)
Creatinine, Ser: 1.11 mg/dL (ref 0.61–1.24)
GFR calc Af Amer: >60 mL/min (ref >60)
GFR calc non Af Amer: >60 mL/min (ref >60)
Glucose, Bld: 108 mg/dL — ABNORMAL HIGH (ref 70–99)
Potassium: 3.8 mmol/L (ref 3.5–5.1)
Sodium: 138 mmol/L (ref 135–145)

## 2018-11-29 LAB — TROPONIN I (HIGH SENSITIVITY): Troponin I (High Sensitivity): 4 ng/L (ref <18)

## 2019-06-20 ENCOUNTER — Other Ambulatory Visit: Payer: Self-pay

## 2019-06-20 ENCOUNTER — Emergency Department: Payer: Self-pay

## 2019-06-20 ENCOUNTER — Emergency Department
Admission: EM | Admit: 2019-06-20 | Discharge: 2019-06-20 | Disposition: A | Discharge status: Home / Self Care | Payer: Self-pay | Attending: Emergency Medicine | Admitting: Emergency Medicine

## 2019-06-20 DIAGNOSIS — Z79899 Other long term (current) drug therapy: Secondary | ICD-10-CM | POA: Insufficient documentation

## 2019-06-20 DIAGNOSIS — R0602 Shortness of breath: Secondary | ICD-10-CM | POA: Insufficient documentation

## 2019-06-20 DIAGNOSIS — Z87891 Personal history of nicotine dependence: Secondary | ICD-10-CM | POA: Insufficient documentation

## 2019-06-20 DIAGNOSIS — K2100 Gastro-esophageal reflux disease with esophagitis, without bleeding: Secondary | ICD-10-CM | POA: Insufficient documentation

## 2019-06-20 DIAGNOSIS — R531 Weakness: Secondary | ICD-10-CM | POA: Insufficient documentation

## 2019-06-20 DIAGNOSIS — R42 Dizziness and giddiness: Secondary | ICD-10-CM | POA: Insufficient documentation

## 2019-06-20 LAB — BASIC METABOLIC PANEL
Anion gap: 8 (ref 5–15)
BUN: 19 mg/dL (ref 6–20)
CO2: 23 mmol/L (ref 22–32)
Calcium: 9.1 mg/dL (ref 8.9–10.3)
Chloride: 108 mmol/L (ref 98–111)
Creatinine, Ser: 1.04 mg/dL (ref 0.61–1.24)
GFR calc Af Amer: >60 mL/min (ref >60)
GFR calc non Af Amer: >60 mL/min (ref >60)
Glucose, Bld: 117 mg/dL — ABNORMAL HIGH (ref 70–99)
Potassium: 3.9 mmol/L (ref 3.5–5.1)
Sodium: 139 mmol/L (ref 135–145)

## 2019-06-20 LAB — CBC
HCT: 38.7 % — ABNORMAL LOW (ref 39.0–52.0)
Hemoglobin: 12.9 g/dL — ABNORMAL LOW (ref 13.0–17.0)
MCH: 28.8 pg (ref 26.0–34.0)
MCHC: 33.3 g/dL (ref 30.0–36.0)
MCV: 86.4 fL (ref 80.0–100.0)
Platelets: 298 10*3/uL (ref 150–400)
RBC: 4.48 MIL/uL (ref 4.22–5.81)
RDW: 13.3 % (ref 11.5–15.5)
WBC: 7.7 10*3/uL (ref 4.0–10.5)
nRBC: 0 % (ref 0.0–0.2)

## 2019-06-20 MED ORDER — ALUM & MAG HYDROXIDE-SIMETH 200-200-20 MG/5ML PO SUSP
30.0000 mL | Freq: Once | ORAL | Status: AC
  Administered 2019-06-20: 30 mL via ORAL
  Filled 2019-06-20: qty 30

## 2019-06-20 MED ORDER — LIDOCAINE VISCOUS HCL 2 % MT SOLN
15.0000 mL | Freq: Once | OROMUCOSAL | Status: AC
  Administered 2019-06-20: 09:00:00 15 mL via ORAL
  Filled 2019-06-20: qty 15

## 2019-06-20 NOTE — Discharge Instructions (Addendum)
For now, I'd recommend the following to see if it helps with these episodes and your sleep:  - Do not eat anything, ESPECIALLY a large meal, after 8 PM (7 PM ideally) - Avoid foods high in acid or fried foods - Try to eat frequent, small meals rather than large meals - Try sleeping slightly more upright on 2 pillows  I'd recommend an over-the-counter antacid for the next 2 weeks: - Omeprazole (Prilosec) generic once daily for 14 days, OR - Famotidine (pepcid) once daily for 14 days  For dizziness, continue over-the-counter Meclizine as needed

## 2019-06-20 NOTE — ED Triage Notes (Signed)
Patient reports dizziness upon waking this morning.

## 2019-06-20 NOTE — ED Provider Notes (Signed)
[facility] Emergency Department Provider Note  ____________________________________________   First MD Initiated Contact with Patient 06/20/19 [phone]     (approximate)  I have reviewed the triage vital signs and the nursing notes.   HISTORY  Chief Complaint Dizziness    HPI Eduardo Edwards is a 29 y.o. male  Here with transient episode of lightheadedness and SOB overnight. Pt reports that he felt fine going to bed last night. He did eat a late, large meal of [employer] at around 10 PM. He awoke at around 5 AM with sensation like he could not catch his breath and was mildly lightheaded. He sat up very quickly, then got out of bed which improved his sx. Denies any overt CP. He did feel an urge to cough. No vomiting. No CP, lightheadedness, dizziness since then. Denies actual room spinning sensation. He has a h/o similar episodes. He also reports that he has noticed he sleeps poorly if he eats late, particularly acidic or meat meals. No fever, chills. No recent med changes. No other complaints.        No past medical history on file.  There are no problems to display for this patient.   No past surgical history on file.  Prior to Admission medications   Medication Sig Start Date End Date Taking? Authorizing Provider  hydrOXYzine (ATARAX/VISTARIL) 25 MG tablet Take 1 tablet (25 mg total) by mouth 3 (three) times daily as needed. 08/30/15   Kem Boroughs B, FNP  ibuprofen (ADVIL) 600 MG tablet Take 1 tablet by mouth three times daily with meals 11/28/18   Loleta Rose, MD  meclizine (ANTIVERT) 25 MG tablet Take 1 tablet (25 mg total) by mouth 3 (three) times daily as needed for dizziness. 07/09/16   Phineas Semen, MD  meclizine (ANTIVERT) 32 MG tablet Take 1 tablet (32 mg total) by mouth 3 (three) times daily as needed. 02/21/16   Darci Current, MD  omeprazole (PRILOSEC OTC) 20 MG tablet Take 1 tablet (20 mg total) by mouth daily. 11/28/18 11/28/19  Loleta Rose, MD  predniSONE (STERAPRED UNI-PAK 21 TAB) 10 MG (21) TBPK tablet Take 6 tablets on day 1 Take 5 tablets on day 2 Take 4 tablets on day 3 Take 3 tablets on day 4 Take 2 tablets on day 5 Take 1 tablet on day 6 08/30/15   Kem Boroughs B, FNP    Allergies Patient has no known allergies.  No family history on file.  Social History Social History   Tobacco Use  . Smoking status: Former Smoker    Types: E-cigarettes  . Smokeless tobacco: Never Used  Substance Use Topics  . Alcohol use: No  . Drug use: No    Review of Systems  Review of Systems  Constitutional: Positive for fatigue. Negative for chills and fever.  HENT: Negative for sore throat.   Respiratory: Positive for shortness of breath.   Cardiovascular: Negative for chest pain.  Gastrointestinal: Negative for abdominal pain.  Genitourinary: Negative for flank pain.  Musculoskeletal: Negative for neck pain.  Skin: Negative for rash and wound.  Allergic/Immunologic: Negative for immunocompromised state.  Neurological: Positive for light-headedness. Negative for weakness and numbness.  Hematological: Does not bruise/bleed easily.  All other systems reviewed and are negative.    ____________________________________________  PHYSICAL EXAM:      VITAL SIGNS: ED Triage Vitals  Enc Vitals Group     BP 06/20/19 0616 122/75     Pulse Rate 06/20/19 0616 78  Resp 06/20/19 0616 18     Temp 06/20/19 0616 98.1 F (36.7 C)     Temp Source 06/20/19 0616 Oral     SpO2 06/20/19 0616 98 %     Weight 06/20/19 0615 252 lb (114.3 kg)     Height 06/20/19 0615 5\' 10"  (1.778 m)     Head Circumference --      Peak Flow --      Pain Score --      Pain Loc --      Pain Edu? --      Excl. in [place]? --      Physical Exam Vitals and nursing note reviewed.  Constitutional:      General: He is not in acute distress.    Appearance: He is well-developed.  HENT:     Head: Normocephalic and atraumatic.  Eyes:      Conjunctiva/sclera: Conjunctivae normal.  Cardiovascular:     Rate and Rhythm: Normal rate and regular rhythm.     Heart sounds: Normal heart sounds.  Pulmonary:     Effort: Pulmonary effort is normal. No respiratory distress.     Breath sounds: No wheezing.  Abdominal:     General: There is no distension.  Musculoskeletal:     Cervical back: Neck supple.  Skin:    General: Skin is warm.     Capillary Refill: Capillary refill takes less than 2 seconds.     Findings: No rash.  Neurological:     Mental Status: He is alert and oriented to person, place, and time.     GCS: GCS eye subscore is 4. GCS verbal subscore is 5. GCS motor subscore is 6.     Cranial Nerves: Cranial nerves are intact.     Sensory: Sensation is intact.     Motor: Motor function is intact. No abnormal muscle tone.       ____________________________________________   LABS (all labs ordered are listed, but only abnormal results are displayed)  Labs Reviewed  BASIC METABOLIC PANEL - Abnormal; Notable for the following components:      Result Value   Glucose, Bld 117 (*)    All other components within normal limits  CBC - Abnormal; Notable for the following components:   Hemoglobin 12.9 (*)    HCT 38.7 (*)    All other components within normal limits    ____________________________________________  EKG: Normal sinus rhythm, VR 73. PR 184, QRS 94, QTc 427. No acute St elevations or depressions. No ischemia or infarct.  ________________________________________  RADIOLOGY All imaging, including plain films, CT scans, and ultrasounds, independently reviewed by me, and interpretations confirmed via formal radiology reads.  ED MD interpretation:   CXR: Clear  Official radiology report(s): DG Chest 2 View  Result Date: 06/20/2019 CLINICAL DATA:  29 year old male with history of shortness of breath and dizziness. EXAM: CHEST - 2 VIEW COMPARISON:  Chest x-ray 11/28/2018. FINDINGS: Lung volumes are normal.  No consolidative airspace disease. No pleural effusions. No pneumothorax. No pulmonary nodule or mass noted. Pulmonary vasculature and the cardiomediastinal silhouette are within normal limits. IMPRESSION: No radiographic evidence of acute cardiopulmonary disease. Electronically Signed   By: Vinnie Langton M.D.   On: 06/20/2019 08:42    ____________________________________________  PROCEDURES   Procedure(s) performed (including Critical Care):  Procedures  ____________________________________________  INITIAL IMPRESSION / MDM / [place] / ED COURSE  As part of my medical decision making, I reviewed the following data within the [place]  Nursing notes reviewed and incorporated, Old chart reviewed, Notes from prior ED visits, and [place] Controlled Substance Database       *Malacki Mcphearson was evaluated in Emergency Department on 06/20/2019 for the symptoms described in the history of present illness. He was evaluated in the context of the global COVID-19 pandemic, which necessitated consideration that the patient might be at risk for infection with the SARS-CoV-2 virus that causes COVID-19. Institutional protocols and algorithms that pertain to the evaluation of patients at risk for COVID-19 are in a state of rapid change based on information released by regulatory bodies including the CDC and federal and state organizations. These policies and algorithms were followed during the patient's care in the ED.  Some ED evaluations and interventions may be delayed as a result of limited staffing during the pandemic.*  Clinical Course as of Jun 20 923  Mon Jun 20, 2019  0853 DG Chest 2 View [CI]    Clinical Course User Index [CI] Shaune Pollack, MD    Medical Decision Making: very pleasant 29 year old male here with transient episode of sensation that he could not breathe and lightheadedness with shortness of breath overnight.  Of note, he has had recurrent issues like  this and my suspicion is that he has underlying acid reflux with microaspiration events causing him to wake up with the symptoms.  He endorses association of these episodes with eating late, particularly large or fatty foods or foods high in acid, as well as nighttime sore throat and hoarseness.  He feels better after sitting upright.  Otherwise, I see no evidence of arrhythmia on EKG or telemetry.  His electrolytes and lab work is reassuring.  He has a mild anemia noted that he can follow-up as an outpatient.  No signs of active bleeding clinically.  Chest x-ray is clear.  No focal neurological deficits.  His dizziness is more so lightheadedness and he has no focal neurological symptoms to suggest posterior stroke or cerebellar abnormality.  Will trial antacids and have him adjust his diet.  Outpatient follow-up encouraged.  No apparent acute medical emergency.  ____________________________________________  FINAL CLINICAL IMPRESSION(S) / ED DIAGNOSES  Final diagnoses:  Gastroesophageal reflux disease with esophagitis without hemorrhage  Lightheadedness     MEDICATIONS GIVEN DURING THIS VISIT:  Medications  alum & mag hydroxide-simeth (MAALOX/MYLANTA) 200-200-20 MG/5ML suspension 30 mL (30 mLs Oral Given 06/20/19 0901)    And  lidocaine (XYLOCAINE) 2 % viscous mouth solution 15 mL (15 mLs Oral Given 06/20/19 0901)     ED Discharge Orders    None       Note:  This document was prepared using Dragon voice recognition software and may include unintentional dictation errors.   Shaune Pollack, MD 06/20/19 [phone]

## 2019-06-20 NOTE — ED Notes (Signed)
Pt has no c/o dizziness currently. Pt reports never having a a dizziness episode like this before. Pt states he was woken up from sleep feeling weak.

## 2019-07-13 ENCOUNTER — Other Ambulatory Visit: Payer: Self-pay

## 2019-07-13 ENCOUNTER — Emergency Department
Admission: EM | Admit: 2019-07-13 | Discharge: 2019-07-13 | Disposition: A | Discharge status: Home / Self Care | Payer: Medicaid Other | Attending: Emergency Medicine | Admitting: Emergency Medicine

## 2019-07-13 ENCOUNTER — Emergency Department: Payer: Medicaid Other

## 2019-07-13 ENCOUNTER — Encounter: Payer: Self-pay | Admitting: Emergency Medicine

## 2019-07-13 DIAGNOSIS — Z87891 Personal history of nicotine dependence: Secondary | ICD-10-CM | POA: Insufficient documentation

## 2019-07-13 DIAGNOSIS — Z791 Long term (current) use of non-steroidal anti-inflammatories (NSAID): Secondary | ICD-10-CM | POA: Insufficient documentation

## 2019-07-13 DIAGNOSIS — Z20822 Contact with and (suspected) exposure to covid-19: Secondary | ICD-10-CM | POA: Insufficient documentation

## 2019-07-13 DIAGNOSIS — K529 Noninfective gastroenteritis and colitis, unspecified: Secondary | ICD-10-CM

## 2019-07-13 LAB — URINALYSIS, COMPLETE (UACMP) WITH MICROSCOPIC
Bacteria, UA: NONE SEEN
Bilirubin Urine: NEGATIVE
Glucose, UA: NEGATIVE mg/dL
Ketones, ur: NEGATIVE mg/dL
Leukocytes,Ua: NEGATIVE
Nitrite: NEGATIVE
Protein, ur: 30 mg/dL — AB
Specific Gravity, Urine: 1.034 — ABNORMAL HIGH (ref 1.005–1.030)
pH: 5 (ref 5.0–8.0)

## 2019-07-13 LAB — COMPREHENSIVE METABOLIC PANEL
ALT: 44 U/L (ref 0–44)
AST: 41 U/L (ref 15–41)
Albumin: 4.3 g/dL (ref 3.5–5.0)
Alkaline Phosphatase: 85 U/L (ref 38–126)
Anion gap: 10 (ref 5–15)
BUN: 14 mg/dL (ref 6–20)
CO2: 24 mmol/L (ref 22–32)
Calcium: 9.1 mg/dL (ref 8.9–10.3)
Chloride: 103 mmol/L (ref 98–111)
Creatinine, Ser: 1.16 mg/dL (ref 0.61–1.24)
GFR calc Af Amer: >60 mL/min (ref >60)
GFR calc non Af Amer: >60 mL/min (ref >60)
Glucose, Bld: 120 mg/dL — ABNORMAL HIGH (ref 70–99)
Potassium: 4 mmol/L (ref 3.5–5.1)
Sodium: 137 mmol/L (ref 135–145)
Total Bilirubin: 0.4 mg/dL (ref 0.3–1.2)
Total Protein: 8.3 g/dL — ABNORMAL HIGH (ref 6.5–8.1)

## 2019-07-13 LAB — CBC
HCT: 42.8 % (ref 39.0–52.0)
Hemoglobin: 14.3 g/dL (ref 13.0–17.0)
MCH: 28.7 pg (ref 26.0–34.0)
MCHC: 33.4 g/dL (ref 30.0–36.0)
MCV: 85.8 fL (ref 80.0–100.0)
Platelets: 307 10*3/uL (ref 150–400)
RBC: 4.99 MIL/uL (ref 4.22–5.81)
RDW: 13.2 % (ref 11.5–15.5)
WBC: 12.1 10*3/uL — ABNORMAL HIGH (ref 4.0–10.5)
nRBC: 0 % (ref 0.0–0.2)

## 2019-07-13 LAB — LIPASE, BLOOD: Lipase: 21 U/L (ref 11–51)

## 2019-07-13 LAB — RESPIRATORY PANEL BY RT PCR (FLU A&B, COVID)
Influenza A by PCR: NEGATIVE
Influenza B by PCR: NEGATIVE
SARS Coronavirus 2 by RT PCR: NEGATIVE

## 2019-07-13 LAB — POC SARS CORONAVIRUS 2 AG: SARS Coronavirus 2 Ag: NEGATIVE

## 2019-07-13 MED ORDER — SODIUM CHLORIDE 0.9 % IV BOLUS
1000.0000 mL | Freq: Once | INTRAVENOUS | Status: AC
  Administered 2019-07-13: 1000 mL via INTRAVENOUS

## 2019-07-13 MED ORDER — ONDANSETRON 4 MG PO TBDP
4.0000 mg | ORAL_TABLET | Freq: Once | ORAL | Status: AC | PRN
  Administered 2019-07-13: 4 mg via ORAL
  Filled 2019-07-13: qty 1

## 2019-07-13 MED ORDER — IOHEXOL 300 MG/ML  SOLN
100.0000 mL | Freq: Once | INTRAMUSCULAR | Status: AC | PRN
  Administered 2019-07-13: 100 mL via INTRAVENOUS

## 2019-07-13 MED ORDER — ACETAMINOPHEN 325 MG PO TABS
650.0000 mg | ORAL_TABLET | Freq: Once | ORAL | Status: AC
  Administered 2019-07-13: 650 mg via ORAL
  Filled 2019-07-13: qty 2

## 2019-07-13 MED ORDER — ONDANSETRON HCL 4 MG/2ML IJ SOLN
4.0000 mg | Freq: Once | INTRAMUSCULAR | Status: DC
  Filled 2019-07-13: qty 2

## 2019-07-13 MED ORDER — ONDANSETRON 4 MG PO TBDP
4.0000 mg | ORAL_TABLET | Freq: Three times a day (TID) | ORAL | 0 refills | Status: DC | PRN
Start: 2019-07-13 — End: 2019-12-20

## 2019-07-13 NOTE — Discharge Instructions (Addendum)
Take the Zofran to help with nausea and vomiting and take Imodium over-the-counter to help with the diarrhea.  Take Tylenol 1 g every 8 hours to help with fevers.  Stay quarantine at home until your Covid test comes back.  IMPRESSION:  1. Negative for acute appendicitis.  2. Diffuse fluid within the small and large bowel without  significant distension, possible enteritis type pattern. No bowel  wall thickening.

## 2019-07-13 NOTE — ED Triage Notes (Addendum)
Pt form home via POV. Pt st yesterday went to [place] and stopped at [employer] and [employer]. Woke up at 5am with lower abd pain, N/V/D, vommitted 2x since 5am, dizziness and fatigue.Pt denies cough, CP/SHOB or recent illness.  Denies COVID exposure or recent COVID vaccine.

## 2019-07-13 NOTE — ED Provider Notes (Signed)
[facility] Emergency Department Provider Note  ____________________________________________   First MD Initiated Contact with Patient 07/13/19 1457     (approximate)  I have reviewed the triage vital signs and the nursing notes.   HISTORY  Chief Complaint Abdominal Pain    HPI Eduardo Edwards is a 29 y.o. male otherwise healthy comes in with abdominal pain.  Patient reports having abdominal pain that started last night.  Is in his lower abdomen, intermittent, moderate currently but will have more severe sharp stabbing sensations, nothing makes it better including Pepto-Bismol, nothing makes it worse.  Patient reports that been associate with some nausea and vomiting as well as very watery diarrhea.  He did not know that he had he had a fever.  Denies any urinary symptoms, shortness of breath, cough, chest pain.  He denies prior abdominal surgeries.  Denies any known Covid contacts.  Has not had Covid before.          History reviewed. No pertinent past medical history.  There are no problems to display for this patient.   History reviewed. No pertinent surgical history.  Prior to Admission medications   Medication Sig Start Date End Date Taking? Authorizing Provider  hydrOXYzine (ATARAX/VISTARIL) 25 MG tablet Take 1 tablet (25 mg total) by mouth 3 (three) times daily as needed. 08/30/15   Kem Boroughs B, FNP  ibuprofen (ADVIL) 600 MG tablet Take 1 tablet by mouth three times daily with meals 11/28/18   Loleta Rose, MD  meclizine (ANTIVERT) 25 MG tablet Take 1 tablet (25 mg total) by mouth 3 (three) times daily as needed for dizziness. 07/09/16   Phineas Semen, MD  meclizine (ANTIVERT) 32 MG tablet Take 1 tablet (32 mg total) by mouth 3 (three) times daily as needed. 02/21/16   Darci Current, MD  omeprazole (PRILOSEC OTC) 20 MG tablet Take 1 tablet (20 mg total) by mouth daily. 11/28/18 11/28/19  Loleta Rose, MD  predniSONE (STERAPRED UNI-PAK 21 TAB)  10 MG (21) TBPK tablet Take 6 tablets on day 1 Take 5 tablets on day 2 Take 4 tablets on day 3 Take 3 tablets on day 4 Take 2 tablets on day 5 Take 1 tablet on day 6 08/30/15   Kem Boroughs B, FNP    Allergies Patient has no known allergies.  History reviewed. No pertinent family history.  Social History Social History   Tobacco Use  . Smoking status: Former Smoker    Types: E-cigarettes  . Smokeless tobacco: Never Used  Substance Use Topics  . Alcohol use: No  . Drug use: No      Review of Systems Constitutional: No fever/chills Eyes: No visual changes. ENT: No sore throat. Cardiovascular: Denies chest pain. Respiratory: Denies shortness of breath. Gastrointestinal: Positive abdominal pain, vomiting, diarrhea no constipation. Genitourinary: Negative for dysuria. Musculoskeletal: Negative for back pain. Skin: Negative for rash. Neurological: Negative for headaches, focal weakness or numbness. All other ROS negative ____________________________________________   PHYSICAL EXAM:  VITAL SIGNS: ED Triage Vitals  Enc Vitals Group     BP 07/13/19 1134 (!) 146/84     Pulse Rate 07/13/19 1134 (!) 124     Resp 07/13/19 1134 18     Temp 07/13/19 1134 (!) 100.8 F (38.2 C)     Temp Source 07/13/19 1134 Oral     SpO2 07/13/19 1134 100 %     Weight 07/13/19 1135 246 lb (111.6 kg)     Height 07/13/19 1135 5\' 10"  (1.778  m)     Head Circumference --      Peak Flow --      Pain Score 07/13/19 1135 6     Pain Loc --      Pain Edu? --      Excl. in [place]? --     Constitutional: Alert and oriented. Well appearing and in no acute distress. Eyes: Conjunctivae are normal. EOMI. Head: Atraumatic. Nose: No congestion/rhinnorhea. Mouth/Throat: Mucous membranes are moist.   Neck: No stridor. Trachea Midline. FROM Cardiovascular: Tachycardic, regular rhythm. Grossly normal heart sounds.  Good peripheral circulation. Respiratory: Normal respiratory effort.  No retractions.  Lungs CTAB. Gastrointestinal: Tender in the lower abdomen no distention. No abdominal bruits.  Musculoskeletal: No lower extremity tenderness nor edema.  No joint effusions. Neurologic:  Normal speech and language. No gross focal neurologic deficits are appreciated.  Skin:  Skin is warm, dry and intact. No rash noted. Psychiatric: Mood and affect are normal. Speech and behavior are normal. GU: Deferred   ____________________________________________   LABS (all labs ordered are listed, but only abnormal results are displayed)  Labs Reviewed  COMPREHENSIVE METABOLIC PANEL - Abnormal; Notable for the following components:      Result Value   Glucose, Bld 120 (*)    Total Protein 8.3 (*)    All other components within normal limits  CBC - Abnormal; Notable for the following components:   WBC 12.1 (*)    All other components within normal limits  URINALYSIS, COMPLETE (UACMP) WITH MICROSCOPIC - Abnormal; Notable for the following components:   Color, Urine YELLOW (*)    APPearance HAZY (*)    Specific Gravity, Urine 1.034 (*)    Hgb urine dipstick MODERATE (*)    Protein, ur 30 (*)    All other components within normal limits  RESPIRATORY PANEL BY RT PCR (FLU A&B, COVID)  LIPASE, BLOOD  POC SARS CORONAVIRUS 2 AG  POC SARS CORONAVIRUS 2 AG -  ED   ____________________________________________   RADIOLOGY   Official radiology report(s): CT ABDOMEN PELVIS W CONTRAST  Result Date: 07/13/2019 CLINICAL DATA:  Abdomen distension lower abdominal pain EXAM: CT ABDOMEN AND PELVIS WITH CONTRAST TECHNIQUE: Multidetector CT imaging of the abdomen and pelvis was performed using the standard protocol following bolus administration of intravenous contrast. CONTRAST:  124mL OMNIPAQUE IOHEXOL 300 MG/ML  SOLN COMPARISON:  None. FINDINGS: Lower chest: No acute abnormality. Hepatobiliary: No focal liver abnormality is seen. No gallstones, gallbladder wall thickening, or biliary dilatation. Pancreas:  Unremarkable. No pancreatic ductal dilatation or surrounding inflammatory changes. Spleen: Normal in size without focal abnormality. Adrenals/Urinary Tract: Adrenal glands are unremarkable. Kidneys are normal, without renal calculi, focal lesion, or hydronephrosis. Bladder is unremarkable. Stomach/Bowel: Stomach within normal limits. Diffuse fluid within the small and large bowel without bowel wall thickening. Negative for appendicitis. Vascular/Lymphatic: No significant vascular findings are present. No enlarged abdominal or pelvic lymph nodes. Reproductive: Prostate is unremarkable. Other: No abdominal wall hernia or abnormality. No abdominopelvic ascites. Musculoskeletal: No acute or significant osseous findings. IMPRESSION: 1. Negative for acute appendicitis. 2. Diffuse fluid within the small and large bowel without significant distension, possible enteritis type pattern. No bowel wall thickening. Electronically Signed   By: Donavan Foil M.D.   On: 07/13/2019 16:29    ____________________________________________   PROCEDURES  Procedure(s) performed (including Critical Care):  Procedures   ____________________________________________   INITIAL IMPRESSION / ASSESSMENT AND PLAN / ED COURSE  Willey Due was evaluated in Emergency Department on 07/13/2019 for the  symptoms described in the history of present illness. He was evaluated in the context of the global COVID-19 pandemic, which necessitated consideration that the patient might be at risk for infection with the SARS-CoV-2 virus that causes COVID-19. Institutional protocols and algorithms that pertain to the evaluation of patients at risk for COVID-19 are in a state of rapid change based on information released by regulatory bodies including the CDC and federal and state organizations. These policies and algorithms were followed during the patient's care in the ED.    Patient is a 29 year old who comes in with concern for food poisoning but  noted to be febrile and tachycardic initially in triage.  Will get labs to evaluate for Electra abnormalities, AKI, UTI.  Given the lower abdominal tenderness I think she had a CT scan to make sure there is no signs of appendicitis or other surgical cause of symptoms.  Will get Covid testing.  He denies any chest pain or shortness of breath to suggest pneumonia.    No evidence of pancreatitis Kidney function is normal. White count is slightly elevated 12.1 Urine with some hemoglobin and protein evidence signs of UTI  Repeat vital signs are much improved.  Patient feeling much better after symptomatic management.  Patient requesting discharge home.  CT imaging was negative except for some concerns for possible enteritis.  Given patient's diarrhea suspect this is more likely gastroenteritis.  Most likely viral in nature.  Patient unable to give stool sample.  Denies any risk factors for C. difficile.  We discussed treatment with Imodium and Zofran and staying quarantine until his Covid test comes back.  Patient is tolerating p.o. and feels much better he feels comfortable with going home at this time  I discussed the provisional nature of ED diagnosis, the treatment so far, the ongoing plan of care, follow up appointments and return precautions with the patient and any family or support people present. They expressed understanding and agreed with the plan, discharged home.  ____________________________________________   FINAL CLINICAL IMPRESSION(S) / ED DIAGNOSES   Final diagnoses:  Gastroenteritis      MEDICATIONS GIVEN DURING THIS VISIT:  Medications  ondansetron (ZOFRAN) injection 4 mg (4 mg Intravenous Refused 07/13/19 1553)  ondansetron (ZOFRAN-ODT) disintegrating tablet 4 mg (4 mg Oral Given 07/13/19 1147)  acetaminophen (TYLENOL) tablet 650 mg (650 mg Oral Given 07/13/19 1147)  sodium chloride 0.9 % bolus 1,000 mL (1,000 mLs Intravenous New Bag/Given 07/13/19 1553)  iohexol (OMNIPAQUE)  300 MG/ML solution 100 mL (100 mLs Intravenous Contrast Given 07/13/19 1616)     ED Discharge Orders         Ordered    ondansetron (ZOFRAN ODT) 4 MG disintegrating tablet  Every 8 hours PRN     07/13/19 1726           Note:  This document was prepared using Dragon voice recognition software and may include unintentional dictation errors.   Concha Se, MD 07/13/19 1726

## 2019-09-05 ENCOUNTER — Other Ambulatory Visit: Payer: Self-pay

## 2019-09-05 ENCOUNTER — Encounter: Payer: Self-pay | Admitting: Emergency Medicine

## 2019-09-05 ENCOUNTER — Emergency Department
Admission: EM | Admit: 2019-09-05 | Discharge: 2019-09-05 | Disposition: A | Discharge status: Home / Self Care | Payer: Medicaid Other | Attending: Emergency Medicine | Admitting: Emergency Medicine

## 2019-09-05 ENCOUNTER — Emergency Department: Payer: Medicaid Other

## 2019-09-05 DIAGNOSIS — K2900 Acute gastritis without bleeding: Secondary | ICD-10-CM

## 2019-09-05 DIAGNOSIS — Z87891 Personal history of nicotine dependence: Secondary | ICD-10-CM | POA: Insufficient documentation

## 2019-09-05 LAB — CBC
HCT: 37 % — ABNORMAL LOW (ref 39.0–52.0)
Hemoglobin: 12.5 g/dL — ABNORMAL LOW (ref 13.0–17.0)
MCH: 28.5 pg (ref 26.0–34.0)
MCHC: 33.8 g/dL (ref 30.0–36.0)
MCV: 84.5 fL (ref 80.0–100.0)
Platelets: 289 10*3/uL (ref 150–400)
RBC: 4.38 MIL/uL (ref 4.22–5.81)
RDW: 13.4 % (ref 11.5–15.5)
WBC: 5.6 10*3/uL (ref 4.0–10.5)
nRBC: 0 % (ref 0.0–0.2)

## 2019-09-05 LAB — BASIC METABOLIC PANEL
Anion gap: 10 (ref 5–15)
BUN: 15 mg/dL (ref 6–20)
CO2: 23 mmol/L (ref 22–32)
Calcium: 9.2 mg/dL (ref 8.9–10.3)
Chloride: 105 mmol/L (ref 98–111)
Creatinine, Ser: 1.21 mg/dL (ref 0.61–1.24)
GFR calc Af Amer: >60 mL/min (ref >60)
GFR calc non Af Amer: >60 mL/min (ref >60)
Glucose, Bld: 115 mg/dL — ABNORMAL HIGH (ref 70–99)
Potassium: 3.7 mmol/L (ref 3.5–5.1)
Sodium: 138 mmol/L (ref 135–145)

## 2019-09-05 LAB — TROPONIN I (HIGH SENSITIVITY): Troponin I (High Sensitivity): 2 ng/L (ref <18)

## 2019-09-05 MED ORDER — SUCRALFATE 1 G PO TABS
1.0000 g | ORAL_TABLET | Freq: Four times a day (QID) | ORAL | 1 refills | Status: DC
Start: 2019-09-05 — End: 2019-12-20

## 2019-09-05 MED ORDER — FAMOTIDINE 20 MG PO TABS
20.0000 mg | ORAL_TABLET | Freq: Two times a day (BID) | ORAL | 0 refills | Status: DC
Start: 2019-09-05 — End: 2019-12-20

## 2019-09-05 MED ORDER — ALUM & MAG HYDROXIDE-SIMETH 200-200-20 MG/5ML PO SUSP
30.0000 mL | Freq: Once | ORAL | Status: AC
  Administered 2019-09-05: 30 mL via ORAL
  Filled 2019-09-05: qty 30

## 2019-09-05 MED ORDER — FAMOTIDINE 20 MG PO TABS
40.0000 mg | ORAL_TABLET | Freq: Once | ORAL | Status: AC
  Administered 2019-09-05: 40 mg via ORAL
  Filled 2019-09-05: qty 2

## 2019-09-05 NOTE — ED Triage Notes (Signed)
Patient states approx. 1 hour ago, patient was standing with his father and he suddenly became short of breath and felt like his heart was racing.  Patient states he now feels some chest tightness to his lower chest.  Patient states he is not as short of breath as he was earlier.  Patient denies any history of the same.  Patient reports some nausea, denies dizziness and diaphoresis.

## 2019-09-05 NOTE — ED Provider Notes (Signed)
[facility] Emergency Department Provider Note  ____________________________________________  Time seen: Approximately 2:52 PM  I have reviewed the triage vital signs and the nursing notes.   HISTORY  Chief Complaint Shortness of Breath and Palpitations    HPI Eduardo Edwards is a 29 y.o. male with no significant past medical history who complains of upper abdominal pain that started at about 10:00 this morning.  He reports last night at dinner he ate at a fast food restaurant, ate a very heavy meal and felt bloated and uncomfortable afterward.  Then this morning he woke up and drank a red bull, and by the time he finished it the epigastric pain has started, nonradiating, no aggravating or alleviating factors.  Lasted about an hour and then abated.  He is now currently feeling much better.  Had some nausea, but no vomiting or diaphoresis or shortness of breath.  Pain is not exertional, not pleuritic.      History reviewed. No pertinent past medical history.   There are no problems to display for this patient.    History reviewed. No pertinent surgical history.   Prior to Admission medications   Medication Sig Start Date End Date Taking? Authorizing Provider  famotidine (PEPCID) 20 MG tablet Take 1 tablet (20 mg total) by mouth 2 (two) times daily. 09/05/19   Sharman Cheek, MD  hydrOXYzine (ATARAX/VISTARIL) 25 MG tablet Take 1 tablet (25 mg total) by mouth 3 (three) times daily as needed. 08/30/15   Kem Boroughs B, FNP  ibuprofen (ADVIL) 600 MG tablet Take 1 tablet by mouth three times daily with meals 11/28/18   Loleta Rose, MD  meclizine (ANTIVERT) 25 MG tablet Take 1 tablet (25 mg total) by mouth 3 (three) times daily as needed for dizziness. 07/09/16   Phineas Semen, MD  meclizine (ANTIVERT) 32 MG tablet Take 1 tablet (32 mg total) by mouth 3 (three) times daily as needed. 02/21/16   Darci Current, MD  omeprazole (PRILOSEC OTC) 20 MG tablet Take 1  tablet (20 mg total) by mouth daily. 11/28/18 11/28/19  Loleta Rose, MD  ondansetron (ZOFRAN ODT) 4 MG disintegrating tablet Take 1 tablet (4 mg total) by mouth every 8 (eight) hours as needed for nausea or vomiting. 07/13/19   Concha Se, MD  predniSONE (STERAPRED UNI-PAK 21 TAB) 10 MG (21) TBPK tablet Take 6 tablets on day 1 Take 5 tablets on day 2 Take 4 tablets on day 3 Take 3 tablets on day 4 Take 2 tablets on day 5 Take 1 tablet on day 6 08/30/15   Triplett, Cari B, FNP  sucralfate (CARAFATE) 1 g tablet Take 1 tablet (1 g total) by mouth 4 (four) times daily. 09/05/19   Sharman Cheek, MD     Allergies Patient has no known allergies.   No family history on file.  Social History Social History   Tobacco Use  . Smoking status: Former Smoker    Types: E-cigarettes  . Smokeless tobacco: Never Used  Vaping Use  . Vaping Use: Some days  Substance Use Topics  . Alcohol use: No  . Drug use: No    Review of Systems  Constitutional:   No fever or chills.  ENT:   No sore throat. No rhinorrhea. Cardiovascular:   No chest pain or syncope. Respiratory:   No dyspnea or cough. Gastrointestinal: Positive epigastric pain as above without vomiting and diarrhea.  Musculoskeletal:   Negative for focal pain or swelling All other systems reviewed and  are negative except as documented above in ROS and HPI.  ____________________________________________   PHYSICAL EXAM:  VITAL SIGNS: ED Triage Vitals [09/05/19 1144]  Enc Vitals Group     BP 116/80     Pulse Rate 70     Resp 18     Temp 98.4 F (36.9 C)     Temp Source Oral     SpO2 100 %     Weight 250 lb (113.4 kg)     Height 5\' 10"  (1.778 m)     Head Circumference      Peak Flow      Pain Score 7     Pain Loc      Pain Edu?      Excl. in [place]?     Vital signs reviewed, nursing assessments reviewed.   Constitutional:   Alert and oriented. Non-toxic appearance. Eyes:   Conjunctivae are normal. EOMI. PERRL. ENT       Head:   Normocephalic and atraumatic.      Nose:   Wearing a mask.      Mouth/Throat:   Wearing a mask.      Neck:   No meningismus. Full ROM. Hematological/Lymphatic/Immunilogical:   No cervical lymphadenopathy. Cardiovascular:   RRR. Symmetric bilateral radial and DP pulses.  No murmurs. Cap refill less than 2 seconds. Respiratory:   Normal respiratory effort without tachypnea/retractions. Breath sounds are clear and equal bilaterally. No wheezes/rales/rhonchi. Gastrointestinal:   Soft with epigastric and left upper quadrant tenderness. Non distended. There is no CVA tenderness.  No rebound, rigidity, or guarding. Musculoskeletal:   Normal range of motion in all extremities. No joint effusions.  No lower extremity tenderness.  No edema. Neurologic:   Normal speech and language.  Motor grossly intact. No acute focal neurologic deficits are appreciated.  Skin:    Skin is warm, dry and intact. No rash noted.  No petechiae, purpura, or bullae.  ____________________________________________    LABS (pertinent positives/negatives) (all labs ordered are listed, but only abnormal results are displayed) Labs Reviewed  BASIC METABOLIC PANEL - Abnormal; Notable for the following components:      Result Value   Glucose, Bld 115 (*)    All other components within normal limits  CBC - Abnormal; Notable for the following components:   Hemoglobin 12.5 (*)    HCT 37.0 (*)    All other components within normal limits  TROPONIN I (HIGH SENSITIVITY)   ____________________________________________   EKG  Interpreted by me  Date: 09/05/2019  Rate: 69  Rhythm: normal sinus rhythm  QRS Axis: normal  Intervals: normal  ST/T Wave abnormalities: normal  Conduction Disutrbances: none  Narrative Interpretation: unremarkable      ____________________________________________    RADIOLOGY  DG Chest 2 View  Result Date: 09/05/2019 CLINICAL DATA:  Chest pain EXAM: CHEST - 2 VIEW COMPARISON:   06/20/2019 FINDINGS: The heart size and mediastinal contours are within normal limits. Both lungs are clear. The visualized skeletal structures are unremarkable. IMPRESSION: No acute abnormality of the lungs. Electronically Signed   By: Eddie Candle M.D.   On: 09/05/2019 13:05    ____________________________________________   PROCEDURES Procedures  ____________________________________________    CLINICAL IMPRESSION / ASSESSMENT AND PLAN / ED COURSE  Medications ordered in the ED: Medications  alum & mag hydroxide-simeth (MAALOX/MYLANTA) 200-200-20 MG/5ML suspension 30 mL (has no administration in time range)  famotidine (PEPCID) tablet 40 mg (has no administration in time range)    Pertinent labs & imaging results  that were available during my care of the patient were reviewed by me and considered in my medical decision making (see chart for details).  Eduardo Edwards was evaluated in Emergency Department on 09/05/2019 for the symptoms described in the history of present illness. He was evaluated in the context of the global COVID-19 pandemic, which necessitated consideration that the patient might be at risk for infection with the SARS-CoV-2 virus that causes COVID-19. Institutional protocols and algorithms that pertain to the evaluation of patients at risk for COVID-19 are in a state of rapid change based on information released by regulatory bodies including the CDC and federal and state organizations. These policies and algorithms were followed during the patient's care in the ED.   Patient presents with upper abdominal pain after heavy fast food meal and red bull recently.  Symptoms and exam are consistent with gastritis.  Given Maalox and Pepcid, continue Carafate and Pepcid at home.   Considering the patient's symptoms, medical history, and physical examination today, I have low suspicion for ACS, PE, TAD, pneumothorax, carditis, mediastinitis, pneumonia, CHF, or sepsis.  Doubt  pancreatitis.  Doubt dysrhythmia, but counseled patient to maintain good hydration and avoid excessive caffeine intake, and if symptoms are recurrent he should seek repeat assessment and possible cardiology referral.        ____________________________________________   FINAL CLINICAL IMPRESSION(S) / ED DIAGNOSES    Final diagnoses:  Acute gastritis without hemorrhage, unspecified gastritis type     ED Discharge Orders         Ordered    sucralfate (CARAFATE) 1 g tablet  4 times daily     Discontinue  Reprint     09/05/19 1450    famotidine (PEPCID) 20 MG tablet  2 times daily     Discontinue  Reprint     09/05/19 1450          Portions of this note were generated with dragon dictation software. Dictation errors may occur despite best attempts at proofreading.   Sharman Cheek, MD 09/05/19 [phone]

## 2019-12-20 ENCOUNTER — Encounter: Payer: Self-pay | Admitting: Emergency Medicine

## 2019-12-20 ENCOUNTER — Ambulatory Visit
Admission: EM | Admit: 2019-12-20 | Discharge: 2019-12-20 | Disposition: A | Discharge status: Home / Self Care | Payer: Medicaid Other | Attending: Family Medicine | Admitting: Family Medicine

## 2019-12-20 ENCOUNTER — Other Ambulatory Visit: Payer: Self-pay

## 2019-12-20 DIAGNOSIS — R0789 Other chest pain: Secondary | ICD-10-CM

## 2019-12-20 MED ORDER — MELOXICAM 15 MG PO TABS: 15.0000 mg | ORAL_TABLET | Freq: Every day | ORAL | 0 refills | Status: DC | PRN

## 2019-12-20 MED ORDER — PANTOPRAZOLE SODIUM 40 MG PO TBEC: 40.0000 mg | DELAYED_RELEASE_TABLET | Freq: Every day | ORAL | 0 refills | Status: DC

## 2019-12-20 NOTE — ED Provider Notes (Signed)
[facility]    CSN: [id number] Arrival date & time: 12/20/19  1507  History   Chief Complaint Chief Complaint  Patient presents with  . Chest Pain  . Shortness of Breath   HPI  29 year old male presents with the above complaints.  Patient reports ongoing chest tightness/pressure.  Has been going on for the past 2 days.  He states that he has had some pain in tingling behind his right ear going down his neck.  He is also had some ongoing shortness of breath.  He is currently not having any pain. No relieving factors. 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 Has known GERD. No other associated symptoms. No other complaints.    Home Medications    Prior to Admission medications   Medication Sig Start Date End Date Taking? Authorizing Provider  meloxicam (MOBIC) 15 MG tablet Take 1 tablet (15 mg total) by mouth daily as needed. 12/20/19   Eduardo Sams, DO  pantoprazole (PROTONIX) 40 MG tablet Take 1 tablet (40 mg total) by mouth daily. 12/20/19   Eduardo Sams, DO  famotidine (PEPCID) 20 MG tablet Take 1 tablet (20 mg total) by mouth 2 (two) times daily. 09/05/19 12/20/19  Sharman Cheek, MD  omeprazole (PRILOSEC OTC) 20 MG tablet Take 1 tablet (20 mg total) by mouth daily. 11/28/18 12/20/19  Loleta Rose, MD  sucralfate (CARAFATE) 1 g tablet Take 1 tablet (1 g total) by mouth 4 (four) times daily. 09/05/19 12/20/19  Sharman Cheek, MD    Family History Family History  Problem Relation Age of Onset  . Healthy Mother   . Gout Father   . Hypertension Father     Social History Social History   Tobacco Use  . Smoking status: Never Smoker  . Smokeless tobacco: Never Used  Vaping Use  . Vaping Use: Former  . Quit date: 11/20/2019  Substance Use Topics  . Alcohol use: Yes    Comment: rare  . Drug use: No     Allergies   Patient has no known allergies.   Review of Systems Review of Systems  Cardiovascular: Positive for chest pain.   Physical  Exam Triage Vital Signs ED Triage Vitals  Enc Vitals Group     BP 12/20/19 1553 125/78     Pulse Rate 12/20/19 1553 83     Resp 12/20/19 1553 18     Temp 12/20/19 1553 98.9 F (37.2 C)     Temp Source 12/20/19 1553 Oral     SpO2 12/20/19 1553 99 %     Weight 12/20/19 1554 258 lb (117 kg)     Height 12/20/19 1554 5\' 10"  (1.778 m)     Head Circumference --      Peak Flow --      Pain Score 12/20/19 1552 5     Pain Loc --      Pain Edu? --      Excl. in [place]? --    Updated Vital Signs BP 125/78 (BP Location: Left Arm)   Pulse 83   Temp 98.9 F (37.2 C) (Oral)   Resp 18   Ht 5\' 10"  (1.778 m)   Wt 117 kg   SpO2 99%   BMI 37.02 kg/m   Visual Acuity Right Eye Distance:   Left Eye Distance:   Bilateral Distance:    Right Eye Near:   Left Eye Near:    Bilateral Near:     Physical Exam Vitals and  nursing note reviewed.  Constitutional:      General: He is not in acute distress.    Appearance: Normal appearance. He is obese.  HENT:     Head: Normocephalic and atraumatic.  Eyes:     General:        Right eye: No discharge.        Left eye: No discharge.     Conjunctiva/sclera: Conjunctivae normal.  Cardiovascular:     Rate and Rhythm: Normal rate and regular rhythm.  Pulmonary:     Effort: Pulmonary effort is normal.     Breath sounds: Normal breath sounds. No wheezing, rhonchi or rales.  Neurological:     Mental Status: He is alert.  Psychiatric:        Mood and Affect: Mood normal.        Behavior: Behavior normal.    UC Treatments / Results  Labs (all labs ordered are listed, but only abnormal results are displayed) Labs Reviewed - No data to display  EKG Interpretation: Normal sinus rhythm with rate of 65.  Normal axis.  Normal intervals.  No ST or T wave changes.  Normal EKG.  Radiology No results found.  Procedures Procedures (including critical care time)  Medications Ordered in UC Medications - No data to display  Initial Impression /  Assessment and Plan / UC Course  I have reviewed the triage vital signs and the nursing notes.  Pertinent labs & imaging results that were available during my care of the patient were reviewed by me and considered in my medical decision making (see chart for details).    29 year old male presents with atypical chest pain.  Likely musculoskeletal given his occupation.  He also has had ongoing issues with GERD.  This is likely playing a role as well.  Treating with meloxicam and Protonix.  Final Clinical Impressions(s) / UC Diagnoses   Final diagnoses:  Atypical chest pain     Discharge Instructions     Medication as prescribed.  EKG normal.  Take care  Dr. Adriana Simas    ED Prescriptions    Medication Sig Dispense Auth. Provider   meloxicam (MOBIC) 15 MG tablet Take 1 tablet (15 mg total) by mouth daily as needed. 30 tablet Eduardo Edwards G, DO   pantoprazole (PROTONIX) 40 MG tablet Take 1 tablet (40 mg total) by mouth daily. 90 tablet Eduardo Edwards Other G, DO     PDMP not reviewed this encounter.   Eduardo Edwards, [place] 12/20/19 Silva Bandy

## 2019-12-20 NOTE — Discharge Instructions (Signed)
Medication as prescribed.  EKG normal.  Take care  Dr. Adriana Simas

## 2019-12-20 NOTE — ED Triage Notes (Signed)
Patient in today c/o chest pressure and tingling behind his right ear and right side of neck x 2 days. Patient also c/o sob off & on x 1 month. Patient denies nausea, arm pain or jaw pain. Patient states he sometimes has dizziness off & on x 4-5 months.

## 2020-01-27 ENCOUNTER — Emergency Department
Admission: EM | Admit: 2020-01-27 | Discharge: 2020-01-28 | Disposition: A | Discharge status: Home / Self Care | Payer: Self-pay | Attending: Emergency Medicine | Admitting: Emergency Medicine

## 2020-01-27 ENCOUNTER — Emergency Department: Payer: Self-pay

## 2020-01-27 ENCOUNTER — Other Ambulatory Visit: Payer: Self-pay

## 2020-01-27 DIAGNOSIS — Z8616 Personal history of COVID-19: Secondary | ICD-10-CM | POA: Insufficient documentation

## 2020-01-27 DIAGNOSIS — R5383 Other fatigue: Secondary | ICD-10-CM | POA: Insufficient documentation

## 2020-01-27 DIAGNOSIS — Z5321 Procedure and treatment not carried out due to patient leaving prior to being seen by health care provider: Secondary | ICD-10-CM | POA: Insufficient documentation

## 2020-01-27 DIAGNOSIS — R079 Chest pain, unspecified: Secondary | ICD-10-CM | POA: Insufficient documentation

## 2020-01-27 LAB — CBC
HCT: 39.5 % (ref 39.0–52.0)
Hemoglobin: 13.2 g/dL (ref 13.0–17.0)
MCH: 28.7 pg (ref 26.0–34.0)
MCHC: 33.4 g/dL (ref 30.0–36.0)
MCV: 85.9 fL (ref 80.0–100.0)
Platelets: 192 10*3/uL (ref 150–400)
RBC: 4.6 MIL/uL (ref 4.22–5.81)
RDW: 13.3 % (ref 11.5–15.5)
WBC: 5.2 10*3/uL (ref 4.0–10.5)
nRBC: 0 % (ref 0.0–0.2)

## 2020-01-27 LAB — BASIC METABOLIC PANEL
Anion gap: 9 (ref 5–15)
BUN: 13 mg/dL (ref 6–20)
CO2: 24 mmol/L (ref 22–32)
Calcium: 8.7 mg/dL — ABNORMAL LOW (ref 8.9–10.3)
Chloride: 103 mmol/L (ref 98–111)
Creatinine, Ser: 1.16 mg/dL (ref 0.61–1.24)
GFR, Estimated: >60 mL/min (ref >60)
Glucose, Bld: 108 mg/dL — ABNORMAL HIGH (ref 70–99)
Potassium: 4 mmol/L (ref 3.5–5.1)
Sodium: 136 mmol/L (ref 135–145)

## 2020-01-27 LAB — TROPONIN I (HIGH SENSITIVITY): Troponin I (High Sensitivity): 3 ng/L (ref <18)

## 2020-01-27 NOTE — ED Triage Notes (Signed)
Chest pain off/on for the past few days.  Also reports fatigue.

## 2020-01-27 NOTE — ED Notes (Addendum)
Pt states he is on day 10 of a positive covid test

## 2020-01-28 ENCOUNTER — Emergency Department: Payer: Self-pay

## 2020-07-28 ENCOUNTER — Other Ambulatory Visit: Payer: Self-pay

## 2020-07-28 ENCOUNTER — Encounter: Payer: Self-pay | Admitting: Emergency Medicine

## 2020-07-28 DIAGNOSIS — J029 Acute pharyngitis, unspecified: Secondary | ICD-10-CM | POA: Insufficient documentation

## 2020-07-28 DIAGNOSIS — R0989 Other specified symptoms and signs involving the circulatory and respiratory systems: Secondary | ICD-10-CM | POA: Insufficient documentation

## 2020-07-28 DIAGNOSIS — Z20822 Contact with and (suspected) exposure to covid-19: Secondary | ICD-10-CM | POA: Insufficient documentation

## 2020-07-28 DIAGNOSIS — R059 Cough, unspecified: Secondary | ICD-10-CM | POA: Insufficient documentation

## 2020-07-28 DIAGNOSIS — Z5321 Procedure and treatment not carried out due to patient leaving prior to being seen by health care provider: Secondary | ICD-10-CM | POA: Insufficient documentation

## 2020-07-28 LAB — RESP PANEL BY RT-PCR (FLU A&B, COVID) ARPGX2
Influenza A by PCR: NEGATIVE
Influenza B by PCR: NEGATIVE
SARS Coronavirus 2 by RT PCR: NEGATIVE

## 2020-07-28 LAB — GROUP A STREP BY PCR: Group A Strep by PCR: NOT DETECTED

## 2020-07-28 NOTE — ED Triage Notes (Signed)
Pt reports that he went to [facility] and had come back is unsure if the change of weather has him feeling worse or if he has the flu. He has been having a sore throat, congestion and cough for the last several days.

## 2020-07-29 ENCOUNTER — Emergency Department
Admission: EM | Admit: 2020-07-29 | Discharge: 2020-07-29 | Disposition: A | Discharge status: Home / Self Care | Payer: Medicaid Other | Attending: Emergency Medicine | Admitting: Emergency Medicine

## 2020-07-29 NOTE — ED Notes (Signed)
Pt called multiple times for room and repeat vital signs, no answer.

## 2021-10-15 IMAGING — CR DG CHEST 2V
1 series · 2 of 2 positions shown · non-contrast
Comparison: Chest x-ray 11/28/2018.

CLINICAL DATA: 29-year-old male with history of shortness of breath
and dizziness.

EXAM:
CHEST - 2 VIEW

[Series 1: dg chest 2 view · 0.14mm/px · 2 of 2 slices shown]
[im 1/2]
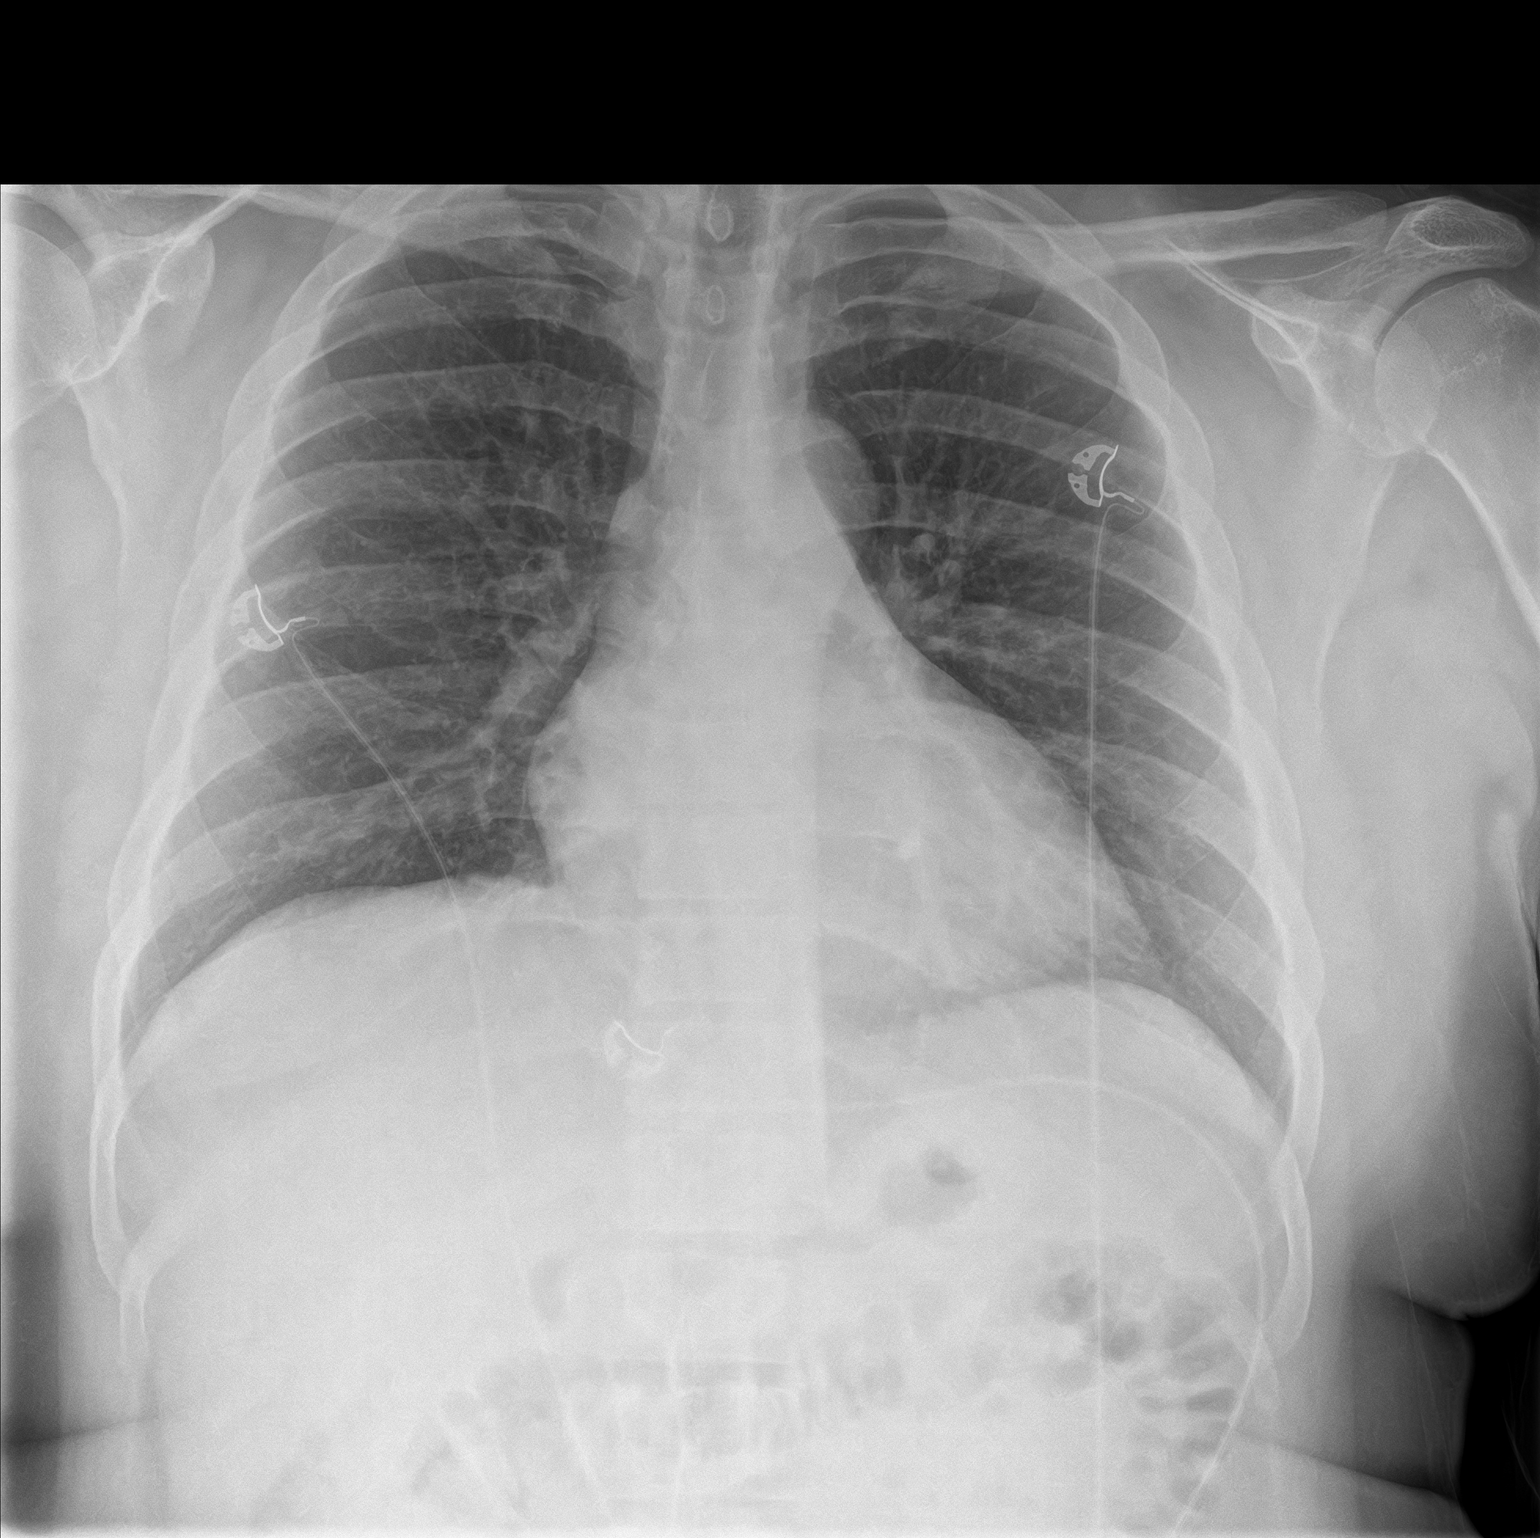
[im 2/2]
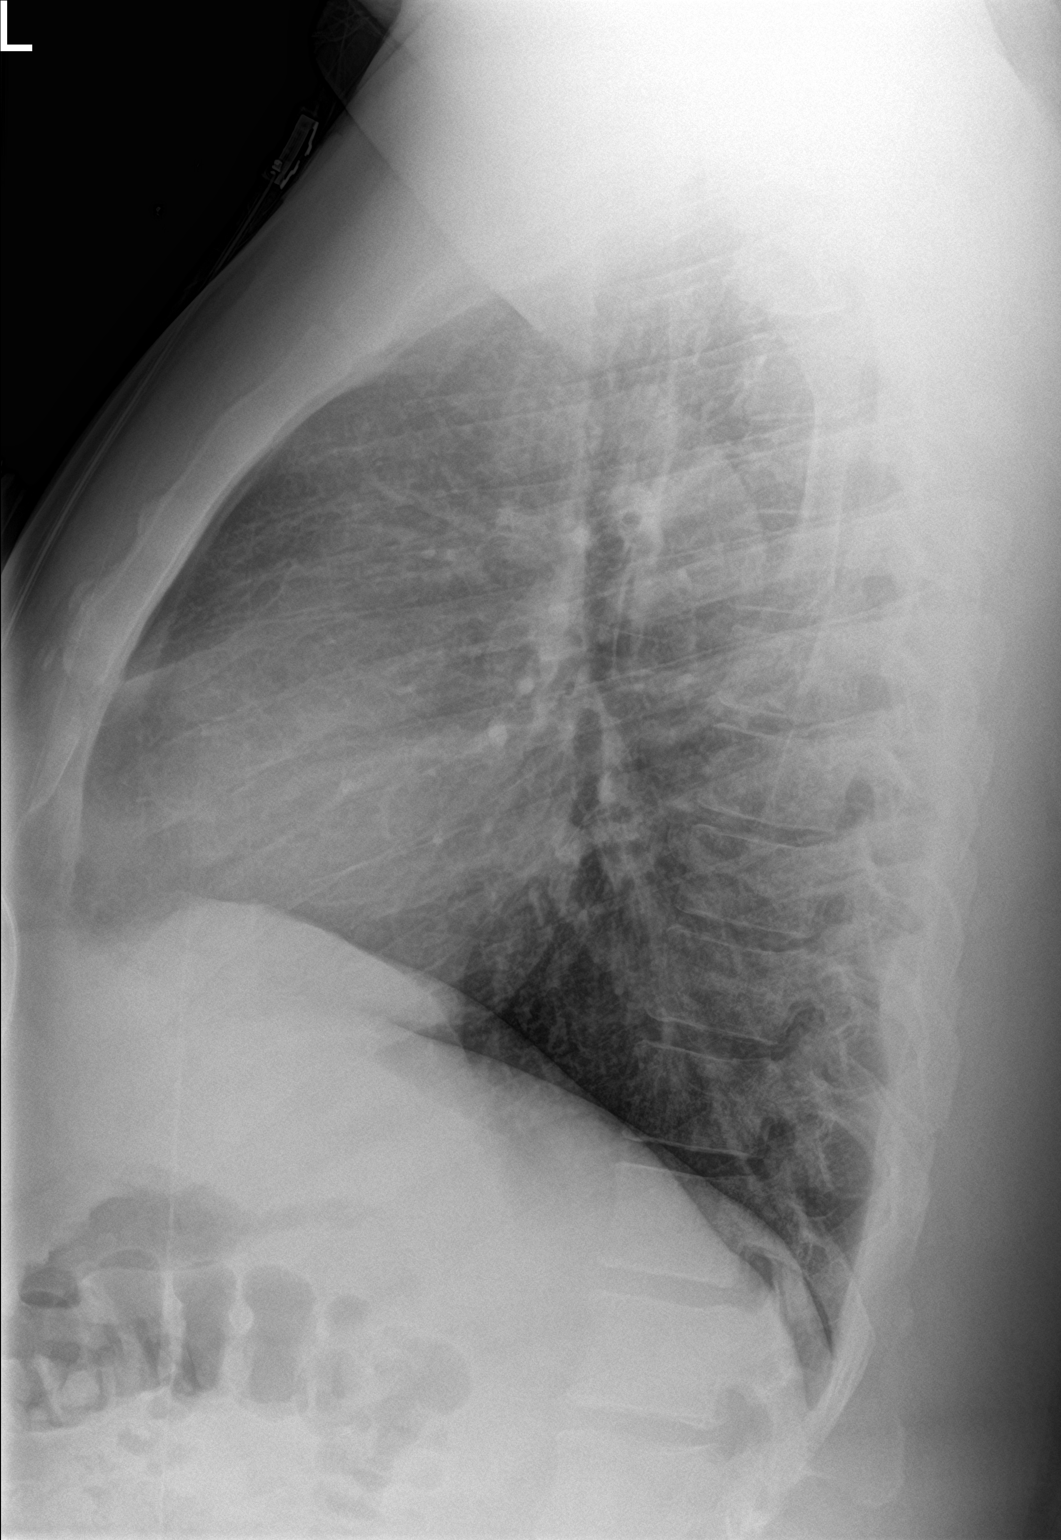

[2 of 2 positions shown; findings below may reference images not displayed]

FINDINGS: Lung volumes are normal. No consolidative airspace disease. No
pleural effusions. No pneumothorax. No pulmonary nodule or mass
noted. Pulmonary vasculature and the cardiomediastinal silhouette
are within normal limits.
IMPRESSION: No radiographic evidence of acute cardiopulmonary disease.

## 2022-08-15 ENCOUNTER — Ambulatory Visit (INDEPENDENT_AMBULATORY_CARE_PROVIDER_SITE_OTHER): Payer: Self-pay | Admitting: Adult Health

## 2022-08-15 VITALS — HR 79 | Temp 98.1°F

## 2022-08-15 DIAGNOSIS — H00021 Hordeolum internum right upper eyelid: Secondary | ICD-10-CM

## 2022-08-15 MED ORDER — ERYTHROMYCIN 5 MG/GM OP OINT
1.0000 | TOPICAL_OINTMENT | Freq: Every day | OPHTHALMIC | 0 refills | Status: DC
Start: 2022-08-15 — End: 2022-11-12

## 2022-08-15 MED ORDER — CIPROFLOXACIN HCL 0.3 % OP SOLN
1.0000 [drp] | OPHTHALMIC | 0 refills | Status: DC
Start: 2022-08-15 — End: 2022-11-12

## 2022-08-15 NOTE — Progress Notes (Signed)
[facility] [address] [place], [place] [address]   Office Visit Note  Patient Name: Eduardo Edwards Date of Birth [birth year]  Medical Record number [id number]  Date of Service: 08/15/2022  Chief Complaint  Patient presents with   Eye Pain     Eye Pain  Associated symptoms include itching. Pertinent negatives include no fever or photophobia.   Pt is here for acute visit.  He reports when he woke up this morning he felt like something was going on in his right eye.  As the morning progressed he feels like there is something in it.  He has some mild blurring of his vision. He feels like his eye is dry, and he has the sensation like something is in it.  He does not wear contacts.   Current Medication:  Outpatient Encounter Medications as of 08/15/2022  Medication Sig   ciprofloxacin (CILOXAN) 0.3 % ophthalmic solution Place 1 drop into the right eye every 4 (four) hours while awake. Administer 1 drop, every 2 hours, while awake, for 2 days. Then 1 drop, every 4 hours, while awake, for the next 5 days.   erythromycin ophthalmic ointment Place 1 Application into the right eye at bedtime.   meloxicam (MOBIC) 15 MG tablet Take 1 tablet (15 mg total) by mouth daily as needed.   pantoprazole (PROTONIX) 40 MG tablet Take 1 tablet (40 mg total) by mouth daily.   [DISCONTINUED] famotidine (PEPCID) 20 MG tablet Take 1 tablet (20 mg total) by mouth 2 (two) times daily.   [DISCONTINUED] omeprazole (PRILOSEC OTC) 20 MG tablet Take 1 tablet (20 mg total) by mouth daily.   [DISCONTINUED] sucralfate (CARAFATE) 1 g tablet Take 1 tablet (1 g total) by mouth 4 (four) times daily.   No facility-administered encounter medications on file as of 08/15/2022.      Medical History: No past medical history on file.   Vital Signs: Pulse 79   Temp 98.1 F (36.7 C)   SpO2 97%    Review of Systems  Constitutional:  Negative for chills and fever.  HENT:  Negative for ear pain, sinus pain and sore  throat.   Eyes:  Positive for pain and itching. Negative for photophobia and visual disturbance.  Respiratory:  Negative for cough.     Physical Exam Vitals reviewed.  Constitutional:      Appearance: Normal appearance.  HENT:     Head: Normocephalic.  Eyes:     General:        Right eye: Hordeolum present.     Pupils: Pupils are equal, round, and reactive to light.   Neurological:     Mental Status: He is alert.    Assessment/Plan: 1. Hordeolum internum of right upper eyelid Use drops and ointment as discussed.  USe warm compress 2-3 times daily.  Follow up via MyChart messenger if symptoms fail to improve or may return to clinic as needed for worsening symptoms.   - ciprofloxacin (CILOXAN) 0.3 % ophthalmic solution; Place 1 drop into the right eye every 4 (four) hours while awake. Administer 1 drop, every 2 hours, while awake, for 2 days. Then 1 drop, every 4 hours, while awake, for the next 5 days.  Dispense: 5 mL; Refill: 0 - erythromycin ophthalmic ointment; Place 1 Application into the right eye at bedtime.  Dispense: 3.5 g; Refill: 0     General Counseling: Kyshaun verbalizes understanding of the findings of todays visit and agrees with plan of treatment. I have discussed  any further diagnostic evaluation that may be needed or ordered today. We also reviewed his medications today. he has been encouraged to call the office with any questions or concerns that should arise related to todays visit.   No orders of the defined types were placed in this encounter.   Meds ordered this encounter  Medications   ciprofloxacin (CILOXAN) 0.3 % ophthalmic solution    Sig: Place 1 drop into the right eye every 4 (four) hours while awake. Administer 1 drop, every 2 hours, while awake, for 2 days. Then 1 drop, every 4 hours, while awake, for the next 5 days.    Dispense:  5 mL    Refill:  0   erythromycin ophthalmic ointment    Sig: Place 1 Application into the right eye at bedtime.     Dispense:  3.5 g    Refill:  0    Time spent:20 Minutes    Johnna Acosta AGNP-C Nurse Practitioner

## 2022-11-12 ENCOUNTER — Encounter: Payer: Self-pay | Admitting: Adult Health

## 2022-11-12 ENCOUNTER — Ambulatory Visit (INDEPENDENT_AMBULATORY_CARE_PROVIDER_SITE_OTHER): Payer: Self-pay | Admitting: Adult Health

## 2022-11-12 ENCOUNTER — Other Ambulatory Visit: Payer: Self-pay

## 2022-11-12 VITALS — BP 110/80 | HR 89 | Temp 97.1°F | Ht 70.0 in | Wt 260.0 lb

## 2022-11-12 DIAGNOSIS — R0789 Other chest pain: Secondary | ICD-10-CM

## 2022-11-12 NOTE — Progress Notes (Signed)
[facility] [address] [place], [place] [address]   Office Visit Note  Patient Name: Eduardo Edwards Date of Birth [birth year]  Medical Record number [id number]  Date of Service: 11/12/2022  Chief Complaint  Patient presents with   Acute Visit    Patient c/o L-sided chest pain that "feels like someone is poking him in the chest with a finger". Symptoms began about 2-3 days ago after he started vaping again. He states he vaped previously but quit about 8 months ago because he was having similar symptoms. Pain resolved when he stopped vaping at that time.      HPI Pt is here for a sick visit. Patient reports for 2-3 days he has been having some chest pain that he describes as tension, and like "someone is poking me in the chest".  He does report some tension with stretching.  He stopped vaping 8 months ago, and due to some tragedy, he started back a week ago.  He stopped vaping again 2 days ago, he feels like the pain is improving now.  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   Denies dizziness, light headedness. No sob.    Current Medication:  Outpatient Encounter Medications as of 11/12/2022  Medication Sig   [DISCONTINUED] ciprofloxacin (CILOXAN) 0.3 % ophthalmic solution Place 1 drop into the right eye every 4 (four) hours while awake. Administer 1 drop, every 2 hours, while awake, for 2 days. Then 1 drop, every 4 hours, while awake, for the next 5 days.   [DISCONTINUED] erythromycin ophthalmic ointment Place 1 Application into the right eye at bedtime.   [DISCONTINUED] famotidine (PEPCID) 20 MG tablet Take 1 tablet (20 mg total) by mouth 2 (two) times daily.   [DISCONTINUED] meloxicam (MOBIC) 15 MG tablet Take 1 tablet (15 mg total) by mouth daily as needed.   [DISCONTINUED] omeprazole (PRILOSEC OTC) 20 MG tablet Take 1 tablet (20 mg total) by mouth daily.   [DISCONTINUED] pantoprazole (PROTONIX) 40 MG tablet Take 1 tablet (40 mg total) by mouth  daily.   [DISCONTINUED] sucralfate (CARAFATE) 1 g tablet Take 1 tablet (1 g total) by mouth 4 (four) times daily.   No facility-administered encounter medications on file as of 11/12/2022.      Medical History: History reviewed. No pertinent past medical history.   Vital Signs: BP 110/80   Pulse 89   Temp (!) 97.1 F (36.2 C)   Ht 5\' 10"  (1.778 m)   Wt 260 lb (117.9 kg)   SpO2 98%   BMI 37.31 kg/m    Review of Systems  Constitutional:  Negative for chills, diaphoresis, fatigue and fever.  HENT:  Negative for sinus pain and sore throat.   Respiratory:  Positive for chest tightness. Negative for cough and shortness of breath.   Cardiovascular:  Negative for chest pain, palpitations and leg swelling.  Gastrointestinal:  Negative for diarrhea, nausea and vomiting.    Physical Exam Vitals reviewed.  Constitutional:      Appearance: Normal appearance.  HENT:     Head: Normocephalic.     Nose: Nose normal.     Mouth/Throat:     Mouth: Mucous membranes are moist.  Cardiovascular:     Rate and Rhythm: Normal rate and regular rhythm.  Pulmonary:     Effort: Pulmonary effort is normal.     Breath sounds: Normal breath sounds.  Chest:       Comments: Mild discomfort in  red area with stretching of arms.  Lymphadenopathy:     Cervical: No cervical adenopathy.  Neurological:     Mental Status: He is alert.    Assessment/Plan: 1. Chest discomfort Appears to be reproducible with movement but not palpation.  EKG-unremarkable.  Discussed ibuprofen, and heat for muscle discomfort.  Discussed ED precautions.  - EKG 12-Lead   General Counseling: bradon karman understanding of the findings of todays visit and agrees with plan of treatment. I have discussed any further diagnostic evaluation that may be needed or ordered today. We also reviewed his medications today. he has been encouraged to call the office with any questions or concerns that should arise related to todays  visit.   Orders Placed This Encounter  Procedures   EKG 12-Lead    No orders of the defined types were placed in this encounter.   Time spent:20 Minutes    Johnna Acosta AGNP-C Nurse Practitioner

## 2023-01-21 ENCOUNTER — Ambulatory Visit: Payer: Self-pay | Admitting: Internal Medicine

## 2023-02-23 ENCOUNTER — Telehealth: Payer: Self-pay | Admitting: Physician Assistant

## 2023-02-24 ENCOUNTER — Encounter: Payer: Self-pay | Admitting: Physician Assistant

## 2023-02-24 ENCOUNTER — Ambulatory Visit (INDEPENDENT_AMBULATORY_CARE_PROVIDER_SITE_OTHER): Payer: BC Managed Care – PPO | Admitting: Physician Assistant

## 2023-02-24 ENCOUNTER — Ambulatory Visit: Payer: BC Managed Care – PPO | Admitting: Physician Assistant

## 2023-02-24 VITALS — BP 115/80 | HR 106 | Temp 98.3°F | Resp 18 | Ht 70.0 in | Wt 296.0 lb

## 2023-02-24 DIAGNOSIS — Z833 Family history of diabetes mellitus: Secondary | ICD-10-CM

## 2023-02-24 DIAGNOSIS — Z7689 Persons encountering health services in other specified circumstances: Secondary | ICD-10-CM | POA: Diagnosis not present

## 2023-02-24 DIAGNOSIS — R0683 Snoring: Secondary | ICD-10-CM

## 2023-02-24 NOTE — Progress Notes (Unsigned)
New patient visit  Patient: Eduardo Edwards   DOB: [birth year]   32 y.o. Male  MRN: [id number] Visit Date: 02/24/2023  Today's healthcare provider: Debera Lat, PA-C   Chief Complaint  Patient presents with   Establish Care   Subjective    Eduardo Edwards is a 32 y.o. male who presents today as a new patient to establish care.  HPI  *** Discussed the use of AI scribe software for clinical note transcription with the patient, who gave verbal consent to proceed.  History of Present Illness         3, 3 3 3 3 3  0 .    02/24/2023    1:55 PM  PHQ9 SCORE ONLY  PHQ-9 Total Score 9      02/24/2023    1:56 PM  GAD 7 : Generalized Anxiety Score  Nervous, Anxious, on Edge 0  Control/stop worrying 0  Worry too much - different things 1  Trouble relaxing 2  Restless 0  Easily annoyed or irritable 0  Afraid - awful might happen 2  Total GAD 7 Score 5  Anxiety Difficulty Not difficult at all      History reviewed. No pertinent past medical history. Past Surgical History:  Procedure Laterality Date   NO PAST SURGERIES     Family Status  Relation Name Status   Mother  Alive   Father  Deceased   Sister 2 Alive   Brother 2 Alive   PGM  Deceased  No partnership data on file   Family History  Problem Relation Age of Onset   Healthy Mother    Gout Father    Hypertension Father    Prostate cancer Father    Cirrhosis Father    Bone cancer Father    Breast cancer Paternal Grandmother    Social History   Socioeconomic History   Marital status: Single    Spouse name: Not on file   Number of children: Not on file   Years of education: Not on file   Highest education level: Not on file  Occupational History   Not on file  Tobacco Use   Smoking status: Never   Smokeless tobacco: Never  Vaping Use   Vaping status: Former   Quit date: 11/20/2019  Substance and Sexual Activity   Alcohol use: Not Currently    Comment: rare   Drug use: No   Sexual activity: Yes  Other  Topics Concern   Not on file  Social History Narrative   Not on file   Social Drivers of Health   Financial Resource Strain: Not on file  Food Insecurity: Not on file  Transportation Needs: Not on file  Physical Activity: Not on file  Stress: Not on file  Social Connections: Not on file   No outpatient medications prior to visit.   No facility-administered medications prior to visit.   No Known Allergies   There is no immunization history on file for this patient.  Health Maintenance  Topic Date Due   HIV Screening  Never done   Hepatitis C Screening  Never done   DTaP/Tdap/Td (1 - Tdap) Never done   INFLUENZA VACCINE  Never done   COVID-19 Vaccine (1 - 2024-25 season) Never done   HPV VACCINES  Aged Out    Patient Care Team: Cherlynn Polo as PCP - General (Physician Assistant)  Review of Systems Except see HPI   {Insert previous labs (optional):23779} {See past labs  Heme  Chem  Endocrine  Serology  Results Review (optional):1}   Objective    BP 115/80   Pulse (!) 106   Temp 98.3 F (36.8 C)   Resp 18   Ht 5\' 10"  (1.778 m)   Wt 296 lb (134.3 kg)   SpO2 97%   BMI 42.47 kg/m  {Insert last BP/Wt (optional):23777}{See vitals history (optional):1}   Physical Exam  Depression Screen    02/24/2023    1:55 PM  PHQ 2/9 Scores  PHQ - 2 Score 1  PHQ- 9 Score 9   No results found for any visits on 02/24/23.  Assessment & Plan     *** Assessment and Plan              Encounter to establish care Welcomed to our clinic Reviewed past medical hx, social hx, family hx and surgical hx Pt advised to send all vaccination records or screening   No follow-ups on file.    The patient was advised to call back or seek an in-person evaluation if the symptoms worsen or if the condition fails to improve as anticipated.  I discussed the assessment and treatment plan with the patient. The patient was provided an opportunity to ask questions and  all were answered. The patient agreed with the plan and demonstrated an understanding of the instructions.  I, Debera Lat, PA-C have reviewed all documentation for this visit. The documentation on  02/24/2023   for the exam, diagnosis, procedures, and orders are all accurate and complete.  Debera Lat, [facility], MMS [facility] [phone] (phone) [phone] (fax)  [facility]

## 2023-02-25 DIAGNOSIS — R0683 Snoring: Secondary | ICD-10-CM | POA: Insufficient documentation

## 2023-02-25 LAB — COMPREHENSIVE METABOLIC PANEL
ALT: 28 [IU]/L (ref 0–44)
AST: 36 [IU]/L (ref 0–40)
Albumin: 4.2 g/dL (ref 4.1–5.1)
Alkaline Phosphatase: 85 [IU]/L (ref 44–121)
BUN/Creatinine Ratio: 17 (ref 9–20)
BUN: 21 mg/dL — ABNORMAL HIGH (ref 6–20)
Bilirubin Total: <0.2 mg/dL (ref 0.0–1.2)
CO2: 25 mmol/L (ref 20–29)
Calcium: 9.9 mg/dL (ref 8.7–10.2)
Chloride: 104 mmol/L (ref 96–106)
Creatinine, Ser: 1.24 mg/dL (ref 0.76–1.27)
Globulin, Total: 3 g/dL (ref 1.5–4.5)
Glucose: 75 mg/dL (ref 70–99)
Potassium: 4.5 mmol/L (ref 3.5–5.2)
Sodium: 142 mmol/L (ref 134–144)
Total Protein: 7.2 g/dL (ref 6.0–8.5)
eGFR: 79 mL/min/{1.73_m2} (ref >59)

## 2023-02-25 LAB — CBC WITH DIFFERENTIAL/PLATELET
Basophils Absolute: 0.1 10*3/uL (ref 0.0–0.2)
Basos: 1 %
EOS (ABSOLUTE): 0.1 10*3/uL (ref 0.0–0.4)
Eos: 1 %
Hematocrit: 38.9 % (ref 37.5–51.0)
Hemoglobin: 12.5 g/dL — ABNORMAL LOW (ref 13.0–17.7)
Immature Grans (Abs): 0 10*3/uL (ref 0.0–0.1)
Immature Granulocytes: 0 %
Lymphocytes Absolute: 3.3 10*3/uL — ABNORMAL HIGH (ref 0.7–3.1)
Lymphs: 37 %
MCH: 27.7 pg (ref 26.6–33.0)
MCHC: 32.1 g/dL (ref 31.5–35.7)
MCV: 86 fL (ref 79–97)
Monocytes Absolute: 0.6 10*3/uL (ref 0.1–0.9)
Monocytes: 6 %
Neutrophils Absolute: 5 10*3/uL (ref 1.4–7.0)
Neutrophils: 55 %
Platelets: 357 10*3/uL (ref 150–450)
RBC: 4.51 x10E6/uL (ref 4.14–5.80)
RDW: 13.8 % (ref 11.6–15.4)
WBC: 9 10*3/uL (ref 3.4–10.8)

## 2023-02-25 LAB — HEMOGLOBIN A1C
Est. average glucose Bld gHb Est-mCnc: 137 mg/dL
Hgb A1c MFr Bld: 6.4 % — ABNORMAL HIGH (ref 4.8–5.6)

## 2023-02-25 LAB — LIPID PANEL
Chol/HDL Ratio: 6 {ratio} — ABNORMAL HIGH (ref 0.0–5.0)
Cholesterol, Total: 143 mg/dL (ref 100–199)
HDL: 24 mg/dL — ABNORMAL LOW (ref >39)
LDL Chol Calc (NIH): 89 mg/dL (ref 0–99)
Triglycerides: 169 mg/dL — ABNORMAL HIGH (ref 0–149)
VLDL Cholesterol Cal: 30 mg/dL (ref 5–40)

## 2023-02-25 LAB — TSH: TSH: 2.2 u[IU]/mL (ref 0.450–4.500)

## 2023-02-26 NOTE — Progress Notes (Signed)
Metformin /glucophage 500mg  daily by mouth with breakfast, 90, refill 0 FOLLOW-UP in  6 weeks

## 2023-03-02 DIAGNOSIS — G4730 Sleep apnea, unspecified: Secondary | ICD-10-CM | POA: Diagnosis not present

## 2023-04-09 ENCOUNTER — Ambulatory Visit: Payer: BC Managed Care – PPO | Admitting: Physician Assistant

## 2023-04-10 ENCOUNTER — Encounter: Payer: Self-pay | Admitting: Physician Assistant

## 2023-07-01 ENCOUNTER — Encounter: Payer: Self-pay | Admitting: Oncology

## 2023-07-01 ENCOUNTER — Ambulatory Visit (INDEPENDENT_AMBULATORY_CARE_PROVIDER_SITE_OTHER): Payer: Self-pay | Admitting: Oncology

## 2023-07-01 ENCOUNTER — Other Ambulatory Visit: Payer: Self-pay

## 2023-07-01 VITALS — BP 122/80 | HR 87 | Temp 98.0°F | Ht 70.0 in | Wt 270.0 lb

## 2023-07-01 DIAGNOSIS — R079 Chest pain, unspecified: Secondary | ICD-10-CM

## 2023-07-01 NOTE — Progress Notes (Signed)
 [facility]  [address] [place], [place] [address] Phone: [phone] Fax: [phone]   Office Visit Note  Patient Name: Eduardo Edwards  Date of [id number]  Med Rec number [id number]  Date of Service: 07/01/2023  Patient has no known allergies.  Chief Complaint  Patient presents with   Chest Pain    Patient states he felt noticeable non-radiating mid-chest pain this morning after waking up. Denies SOB. Pain comes and goes with certain movements such as moving from side to side. He states he slept awkwardly on his R shoulder last night. He moves pianos on the weekends. He has not taken any OTC pain meds.      HPI Patient is an 33 y.o. male with substernal chest pain that he noticed this morning.  Reports has had similar issues in the past but it resolved on their own.  States he thought he may have slept funny last night but is also very active in his job here at [employer] and on the weekends moving PNO's.  Reports he is also remodeling a home and home cabinets yesterday evening by himself.  Reports the pain is intermittent and only occurs with certain activities.  He reports feeling the pain when he was reaching for drink this morning.  States he felt it a lot this morning when he made the appointment but now that he has been at work he has not noticed it at all.  He has not tried thing over-the-counter for symptoms.  He is here to rule out any type of cardiac involvement.  Had mild lightheadedness this morning.  No history of cardiac concerns.  He is not on any medications.  Reports he does have quite a bit of healthcare anxiety.  Current Medication:  Outpatient Encounter Medications as of 07/01/2023  Medication Sig   [DISCONTINUED] famotidine  (PEPCID ) 20 MG tablet Take 1 tablet (20 mg total) by mouth 2 (two) times daily.   [DISCONTINUED] omeprazole  (PRILOSEC  OTC) 20 MG tablet Take 1 tablet (20 mg total) by mouth daily.   [DISCONTINUED] sucralfate  (CARAFATE ) 1 g tablet Take  1 tablet (1 g total) by mouth 4 (four) times daily.   No facility-administered encounter medications on file as of 07/01/2023.     Medical History: History reviewed. No pertinent past medical history.   Vital Signs: BP 122/80   Pulse 87   Temp 98 F (36.7 C)   Ht 5\' 10"  (1.778 m)   Wt 270 lb (122.5 kg)   SpO2 98%   BMI 38.74 kg/m   ROS: As per HPI.  All other pertinent ROS negative.     Review of Systems  Constitutional:  Negative for diaphoresis and fatigue.  Respiratory:  Negative for cough, chest tightness and shortness of breath.   Cardiovascular:  Positive for chest pain. Negative for palpitations.  Gastrointestinal:  Negative for abdominal pain.  Neurological:  Positive for dizziness. Negative for headaches.  Hematological:  Negative for adenopathy.    Physical Exam Constitutional:      Appearance: He is obese.  Cardiovascular:     Rate and Rhythm: Normal rate and regular rhythm.     Pulses: Normal pulses.     Heart sounds: Normal heart sounds.  Pulmonary:     Effort: Pulmonary effort is normal.  Chest:     Chest wall: No mass, tenderness or edema.  Abdominal:     General: Bowel sounds are normal.     Palpations: Abdomen is soft.  Musculoskeletal:  Right lower leg: No edema.     Left lower leg: No edema.  Neurological:     Mental Status: He is alert and oriented to person, place, and time.    No results found for this or any previous visit (from the past 24 hours).  Assessment/Plan: 1. Chest pain, unspecified type (Primary) Likely musculoskeletal in nature.  Cardiac exam benign.  Vital signs stable.  He is currently not on any medications.  Pain is not constant and occurs intermittently.  We discussed trying NSAIDs 2-3 times per day anywhere from 600 to 800 mg along with rest and avoid lifting heavy objects for the next couple of days to see if symptoms improve.  We discussed potentially getting an EKG if symptoms are persistent and/or  worsen.  General Counseling: korby ratay understanding of the findings of todays visit and agrees with plan of treatment. I have discussed any further diagnostic evaluation that may be needed or ordered today. We also reviewed his medications today. he has been encouraged to call the office with any questions or concerns that should arise related to todays visit.   No orders of the defined types were placed in this encounter.   No orders of the defined types were placed in this encounter.   I spent 20 minutes dedicated to the care of this patient (face-to-face and non-face-to-face) on the date of the encounter to include what is described in the assessment and plan.   Charlton Cooler, NP 07/01/2023 1:16 PM

## 2023-09-25 ENCOUNTER — Other Ambulatory Visit: Payer: Self-pay

## 2023-09-25 ENCOUNTER — Ambulatory Visit (INDEPENDENT_AMBULATORY_CARE_PROVIDER_SITE_OTHER): Payer: Self-pay | Admitting: Physician Assistant

## 2023-09-25 ENCOUNTER — Encounter: Payer: Self-pay | Admitting: Physician Assistant

## 2023-09-25 VITALS — BP 126/83 | HR 72 | Temp 98.3°F | Ht 70.0 in | Wt 264.0 lb

## 2023-09-25 DIAGNOSIS — S29011A Strain of muscle and tendon of front wall of thorax, initial encounter: Secondary | ICD-10-CM

## 2023-09-25 NOTE — Progress Notes (Signed)
 [facility] [address] [place], [place] [address]   Office Visit Note  Patient Name: Eduardo Edwards Date of Birth [birth year]  Medical Record number [id number]  Date of Service: 09/25/2023  Chief Complaint  Patient presents with   Flank Pain    Patient c/o R side pain after waking up this morning. It is tender to the touch.      HPI Pt is here for a sick visit. Pt states he woke up yesterday morning and started having pain in his R lateral rib/flank area. Says it's kind of sharp, nonradiating, constant but waxing and waning. Seems a little better today, but certain movements make it hurt. Leaning to the left makes it worse, or getting up from a seated position. Able to palpate and reproduce it.  Hasn't tried anything for it.  Did some heavy lifting and additional movement activities the day.  No other symptoms, no abd pain/n/v/d/c. No urinary symptoms, frequency, urgency, hematuria, dysuria. No fevers/chills. No CP, SOB.    ROS: Review of Systems  Constitutional:  Negative for chills and fever.  Respiratory:  Negative for cough and shortness of breath.   Cardiovascular:  Negative for chest pain.  Gastrointestinal:  Negative for abdominal pain, constipation, diarrhea, nausea and vomiting.  Genitourinary:  Positive for flank pain. Negative for decreased urine volume, dysuria, frequency, hematuria and urgency.  Musculoskeletal:  Negative for arthralgias and myalgias.     Current Medication:  Outpatient Encounter Medications as of 09/25/2023  Medication Sig   [DISCONTINUED] famotidine  (PEPCID ) 20 MG tablet Take 1 tablet (20 mg total) by mouth 2 (two) times daily.   [DISCONTINUED] omeprazole  (PRILOSEC  OTC) 20 MG tablet Take 1 tablet (20 mg total) by mouth daily.   [DISCONTINUED] sucralfate  (CARAFATE ) 1 g tablet Take 1 tablet (1 g total) by mouth 4 (four) times daily.   No facility-administered encounter medications on file as of 09/25/2023.      Medical History: History  reviewed. No pertinent past medical history.   Vital Signs: BP 126/83   Pulse 72   Temp 98.3 F (36.8 C)   Ht 5' 10 (1.778 m)   Wt 119.7 kg   SpO2 96%   BMI 37.88 kg/m    Physical Exam Vitals and nursing note reviewed.  Constitutional:      General: He is not in acute distress.    Appearance: Normal appearance. He is well-developed. He is not toxic-appearing.     Comments: Afebrile, nontoxic, NAD  HENT:     Head: Normocephalic and atraumatic.     Mouth/Throat:     Mouth: Mucous membranes are moist.  Eyes:     General:        Right eye: No discharge.        Left eye: No discharge.     Conjunctiva/sclera: Conjunctivae normal.  Cardiovascular:     Rate and Rhythm: Normal rate and regular rhythm.     Pulses: Normal pulses.     Heart sounds: Normal heart sounds, S1 normal and S2 normal. No murmur heard.    No friction rub. No gallop.  Pulmonary:     Effort: Pulmonary effort is normal. No respiratory distress.     Breath sounds: Normal breath sounds. No decreased breath sounds, wheezing, rhonchi or rales.  Abdominal:     General: Bowel sounds are normal. There is no distension.     Palpations: Abdomen is soft. Abdomen is not rigid.     Tenderness: There is no abdominal tenderness.  There is no right CVA tenderness, left CVA tenderness, guarding or rebound. Negative signs include Murphy's sign.      Comments: No abd TTP, neg murphy's, no CVA TTP.  Mild TTP to R lateral rib cage area, along obliques area.  Musculoskeletal:        General: Normal range of motion.     Cervical back: Normal range of motion and neck supple.     Comments: No spinal TTP, no paraspinous muscle TTP  Skin:    General: Skin is warm and dry.     Findings: No rash.  Neurological:     Mental Status: He is alert and oriented to person, place, and time.     Sensory: Sensation is intact. No sensory deficit.     Motor: Motor function is intact.  Psychiatric:        Mood and Affect: Mood and affect  normal.        Behavior: Behavior normal.       Assessment/Plan: 1. Muscle strain of chest wall, initial encounter (Primary)   Pt here with likely muscle strain of R lateral rib area. No other associated features in history or on exam, doubt kidney stone/gallbladder etiology, doubt shingles, doubt other emergent etiology. Advised heat/ice, NSAIDs, and f/up if symptoms aren't resolving or they are worsening. Strict ED/return precautions discussed.    General Counseling: Eduardo Edwards understanding of the findings of todays visit and agrees with plan of treatment. I have discussed any further diagnostic evaluation that may be needed or ordered today. We also reviewed his medications today. he has been encouraged to call the office with any questions or concerns that should arise related to todays visit.   No orders of the defined types were placed in this encounter.  No results found for this or any previous visit (from the past 24 hours).   No orders of the defined types were placed in this encounter.   Time spent: [address] Development worker, international aid

## 2023-10-17 ENCOUNTER — Other Ambulatory Visit: Payer: Self-pay

## 2023-10-17 ENCOUNTER — Emergency Department
Admission: EM | Admit: 2023-10-17 | Discharge: 2023-10-17 | Disposition: A | Discharge status: Home / Self Care | Attending: Emergency Medicine | Admitting: Emergency Medicine

## 2023-10-17 DIAGNOSIS — X58XXXA Exposure to other specified factors, initial encounter: Secondary | ICD-10-CM | POA: Diagnosis not present

## 2023-10-17 DIAGNOSIS — M546 Pain in thoracic spine: Secondary | ICD-10-CM

## 2023-10-17 DIAGNOSIS — S29012A Strain of muscle and tendon of back wall of thorax, initial encounter: Secondary | ICD-10-CM | POA: Insufficient documentation

## 2023-10-17 DIAGNOSIS — S299XXA Unspecified injury of thorax, initial encounter: Secondary | ICD-10-CM | POA: Diagnosis not present

## 2023-10-17 NOTE — ED Triage Notes (Signed)
 Pt to ED for right sided back pain for a few weeks, worsens with movement, pain started while working on [employer], [employer].  Denies n/v/d, urinary sx

## 2023-10-17 NOTE — ED Provider Notes (Signed)
 [facility] Emergency Department Provider Note     Event Date/Time   First MD Initiated Contact with Patient 10/17/23 1016     (approximate)   History   Back Pain   HPI  Eduardo Edwards is a 33 y.o. male with a history of obesity, presents to the ED endorsing right-sided back pain.  Patient was putting several weeks of intermittent symptoms.  Symptoms aggravated by movement.  He noted onset of symptoms while working on his lawn more recently.  He localizes pain to the mid axillary line of the right sided thoracic musculature.  No reports of any chest pain or shortness of breath.  He denies any bladder or bowel incontinence, foot drop, saddle anesthesia.  No dysuria, or bowel changes reported.   Physical Exam   Triage Vital Signs: ED Triage Vitals [10/17/23 1007]  Encounter Vitals Group     BP (!) 124/92     Girls Systolic BP Percentile      Girls Diastolic BP Percentile      Boys Systolic BP Percentile      Boys Diastolic BP Percentile      Pulse Rate 78     Resp 18     Temp 97.8 F (36.6 C)     Temp src      SpO2 100 %     Weight 266 lb (120.7 kg)     Height 5' 10 (1.778 m)     Head Circumference      Peak Flow      Pain Score 4     Pain Loc      Pain Education      Exclude from Growth Chart     Most recent vital signs: Vitals:   10/17/23 1007  BP: (!) 124/92  Pulse: 78  Resp: 18  Temp: 97.8 F (36.6 C)  SpO2: 100%    General Awake, no distress. NAD HEENT NCAT. PERRL. EOMI. No rhinorrhea. Mucous membranes are moist.  CV:  Good peripheral perfusion. RRR RESP:  Normal effort. CTA MSK:  Normal spinal alignment without midline tenderness, spasm, deformity, or step-off.  Active range of motion of all extremities.  Reproducible pain to palpation over the right latissimus dorsi muscle in the mid axillary line.  No chest wall deformity or dyskinetic movement noted. NEURO: Cranial nerves II to XII grossly intact.   ED Results /  Procedures / Treatments   Labs (all labs ordered are listed, but only abnormal results are displayed) Labs Reviewed - No data to display   EKG   RADIOLOGY  No results found.   PROCEDURES:  Critical Care performed: No  Procedures   MEDICATIONS ORDERED IN ED: Medications - No data to display   IMPRESSION / MDM / ASSESSMENT AND PLAN / ED COURSE  I reviewed the triage vital signs and the nursing notes.                              Differential diagnosis includes, but is not limited to, lumbar strain, myalgia, lumbar radiculopathy  Patient's presentation is most consistent with acute, uncomplicated illness.  Patient's diagnosis is consistent with thoracic muscle strain.  Patient presents in no acute distress, endorsing intermittent symptoms of right sided thoracic muscle wall tenderness after initial injury.  No chest wall deformity noted.  No frank chest pain or shortness of breath.  Patient presents with reproducible discomfort over the musculature in the mid  axillary line.  No indication for further imaging or intervention based on benign presentation.  Patient will be discharged home with instructions to take OTC Tylenol  or Motrin  as needed. Patient is to follow up with his PCP as suggested, as needed or otherwise directed. Patient is given ED precautions to return to the ED for any worsening or new symptoms.   FINAL CLINICAL IMPRESSION(S) / ED DIAGNOSES   Final diagnoses:  Acute right-sided thoracic back pain     Rx / DC Orders   ED Discharge Orders     None        Note:  This document was prepared using Dragon voice recognition software and may include unintentional dictation errors.    Loyd Candida LULLA Aldona, PA-C 10/17/23 1048    Dorothyann Drivers, MD 10/17/23 1353

## 2023-10-17 NOTE — Discharge Instructions (Signed)
 Your exam is normal and reassuring at this time.  You have a muscle strain to the left side of your mid back.  Consider OTC ibuprofen  or naproxen for pain relief.  May also apply moist heat compresses or ice packs help reduce symptoms.  Lidocaine  patches off-the-shelf may also be beneficial.  Follow-up with your primary provider or return to the ED if needed.

## 2023-10-17 NOTE — ED Notes (Signed)
 Patient stated he has had back pain for approximately two weeks. 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 NAD

## 2023-10-21 ENCOUNTER — Emergency Department
Admission: EM | Admit: 2023-10-21 | Discharge: 2023-10-21 | Disposition: A | Discharge status: Home / Self Care | Attending: Emergency Medicine | Admitting: Emergency Medicine

## 2023-10-21 ENCOUNTER — Other Ambulatory Visit: Payer: Self-pay

## 2023-10-21 ENCOUNTER — Emergency Department

## 2023-10-21 DIAGNOSIS — F419 Anxiety disorder, unspecified: Secondary | ICD-10-CM | POA: Diagnosis not present

## 2023-10-21 DIAGNOSIS — R0602 Shortness of breath: Secondary | ICD-10-CM | POA: Insufficient documentation

## 2023-10-21 DIAGNOSIS — R002 Palpitations: Secondary | ICD-10-CM | POA: Diagnosis not present

## 2023-10-21 LAB — COMPREHENSIVE METABOLIC PANEL WITH GFR
ALT: 16 U/L (ref 0–44)
AST: 25 U/L (ref 15–41)
Albumin: 3.9 g/dL (ref 3.5–5.0)
Alkaline Phosphatase: 59 U/L (ref 38–126)
Anion gap: 8 (ref 5–15)
BUN: 15 mg/dL (ref 6–20)
CO2: 25 mmol/L (ref 22–32)
Calcium: 9.3 mg/dL (ref 8.9–10.3)
Chloride: 106 mmol/L (ref 98–111)
Creatinine, Ser: 1.03 mg/dL (ref 0.61–1.24)
GFR, Estimated: >60 mL/min (ref >60)
Glucose, Bld: 102 mg/dL — ABNORMAL HIGH (ref 70–99)
Potassium: 4 mmol/L (ref 3.5–5.1)
Sodium: 139 mmol/L (ref 135–145)
Total Bilirubin: 0.8 mg/dL (ref 0.0–1.2)
Total Protein: 7.5 g/dL (ref 6.5–8.1)

## 2023-10-21 LAB — CBC WITH DIFFERENTIAL/PLATELET
Abs Immature Granulocytes: 0.02 K/uL (ref 0.00–0.07)
Basophils Absolute: 0 K/uL (ref 0.0–0.1)
Basophils Relative: 1 %
Eosinophils Absolute: 0 K/uL (ref 0.0–0.5)
Eosinophils Relative: 0 %
HCT: 38.7 % — ABNORMAL LOW (ref 39.0–52.0)
Hemoglobin: 12.5 g/dL — ABNORMAL LOW (ref 13.0–17.0)
Immature Granulocytes: 0 %
Lymphocytes Relative: 30 %
Lymphs Abs: 2.1 K/uL (ref 0.7–4.0)
MCH: 27.9 pg (ref 26.0–34.0)
MCHC: 32.3 g/dL (ref 30.0–36.0)
MCV: 86.4 fL (ref 80.0–100.0)
Monocytes Absolute: 0.3 K/uL (ref 0.1–1.0)
Monocytes Relative: 4 %
Neutro Abs: 4.4 K/uL (ref 1.7–7.7)
Neutrophils Relative %: 65 %
Platelets: 319 K/uL (ref 150–400)
RBC: 4.48 MIL/uL (ref 4.22–5.81)
RDW: 13.8 % (ref 11.5–15.5)
WBC: 6.8 K/uL (ref 4.0–10.5)
nRBC: 0 % (ref 0.0–0.2)

## 2023-10-21 LAB — MAGNESIUM: Magnesium: 2 mg/dL (ref 1.7–2.4)

## 2023-10-21 LAB — TROPONIN I (HIGH SENSITIVITY): Troponin I (High Sensitivity): 3 ng/L (ref <18)

## 2023-10-21 MED ORDER — HYDROXYZINE HCL 25 MG PO TABS: 25.0000 mg | ORAL_TABLET | Freq: Three times a day (TID) | ORAL | 0 refills | Status: DC | PRN

## 2023-10-21 NOTE — ED Triage Notes (Signed)
 C/O intermittent episodes of panic attacks x 3-4 days. Yesterday was an episode that is the worse he has ever had. Describes ongoing anxiety over past years and patient states I have learned to live with it. Arrives today denies current anxiety, states he had an episode earlier today but was able to calm self down.  AAOx3. Skin warm and dry. NAD

## 2023-10-21 NOTE — Discharge Instructions (Addendum)
 You are seen in the today for evaluation of your palpitations.  Your testing fortunately did not show an emergency cause for this.  Follow-up with your primary care doctor for further evaluation.  Return to the ER for new or worsening symptoms.  I sent a short course of a medication that you can try to see if that helps with your anxiety.  This is not a good long-term medicine, and is important that you follow-up closely as an outpatient for further evaluation.

## 2023-10-21 NOTE — ED Provider Notes (Signed)
 [facility] Provider Note    Event Date/Time   First MD Initiated Contact with Patient 10/21/23 1011     (approximate)   History   Anxiety   HPI  Eduardo Edwards is a 33 year old male presenting to the emergency department for evaluation of palpitations.  Patient reports that he has a history of what he describes as anxiety, but no formal diagnosis and has not previously sought care for it.  He does report some increase stressors recently and past few days has had episodes of palpitations with associated shortness of breath.  No chest pain.  Episode currently resolved.     Physical Exam   Triage Vital Signs: ED Triage Vitals  Encounter Vitals Group     BP 10/21/23 0740 128/83     Girls Systolic BP Percentile --      Girls Diastolic BP Percentile --      Boys Systolic BP Percentile --      Boys Diastolic BP Percentile --      Pulse Rate 10/21/23 0740 70     Resp 10/21/23 0740 16     Temp 10/21/23 0740 98.1 F (36.7 C)     Temp Source 10/21/23 0740 Oral     SpO2 10/21/23 0740 100 %     Weight 10/21/23 0741 265 lb 14 oz (120.6 kg)     Height --      Head Circumference --      Peak Flow --      Pain Score 10/21/23 0741 0     Pain Loc --      Pain Education --      Exclude from Growth Chart --     Most recent vital signs: Vitals:   10/21/23 0740 10/21/23 1145  BP: 128/83 (!) 130/91  Pulse: 70 70  Resp: 16 14  Temp: 98.1 F (36.7 C) 98 F (36.7 C)  SpO2: 100% 98%     General: Awake, interactive  CV:  Regular rate, good peripheral perfusion.  Resp:  Unlabored respirations, lungs clear to auscultation Abd:  Nondistended.  Neuro:  Symmetric facial movement, fluid speech   ED Results / Procedures / Treatments   Labs (all labs ordered are listed, but only abnormal results are displayed) Labs Reviewed  CBC WITH DIFFERENTIAL/PLATELET - Abnormal; Notable for the following components:      Result Value   Hemoglobin 12.5 (*)    HCT 38.7  (*)    All other components within normal limits  COMPREHENSIVE METABOLIC PANEL WITH GFR - Abnormal; Notable for the following components:   Glucose, Bld 102 (*)    All other components within normal limits  MAGNESIUM   TROPONIN I (HIGH SENSITIVITY)     EKG EKG independently reviewed and interpreted by myself demonstrates:  EKG demonstrates normal sinus rhythm at a rate of 68, PR 192, QRS 96, QTc 412, no acute ST changes  RADIOLOGY Imaging independently reviewed and interpreted by myself demonstrates:  CXR without focal consolidation  Formal Radiology Read:  DG Chest Portable 1 View Result Date: 10/21/2023 CLINICAL DATA:  Shortness of breath. EXAM: PORTABLE CHEST 1 VIEW COMPARISON:  09/05/2019 FINDINGS: The lungs are clear without focal pneumonia, edema, pneumothorax or pleural effusion. Cardiopericardial silhouette is at upper limits of normal for size. No acute bony abnormality. IMPRESSION: No active disease. Electronically Signed   By: Camellia Candle M.D.   On: 10/21/2023 11:34    PROCEDURES:  Critical Care performed: No  Procedures   MEDICATIONS  ORDERED IN ED: Medications - No data to display   IMPRESSION / MDM / ASSESSMENT AND PLAN / ED COURSE  I reviewed the triage vital signs and the nursing notes.  Differential diagnosis includes, but is not limited to, arrhythmia, anemia, electrolyte abnormality, stress mediated physiologic response low suspicion PE or aortic dissection given complete resolution of episodes in between events.  Patient's presentation is most consistent with acute presentation with potential threat to life or bodily function.  33 year old male presenting with episodes of palpitations and shortness of breath, no current symptoms.  Reassuring labs including CBC with mild anemia, CMP without significant derangement.  Normal troponin and magnesium .  EKG without acute ischemic findings.  Chest x-Shanaiya Bene without focal consolidation.  Patient reassessed and updated  on results of workup.  No new complaints.  He is comfortable with discharge and outpatient follow-up.  Patient discharged stable condition.  FINAL CLINICAL IMPRESSION(S) / ED DIAGNOSES   Final diagnoses:  Palpitations  Anxious mood     Rx / DC Orders   ED Discharge Orders     None        Note:  This document was prepared using Dragon voice recognition software and may include unintentional dictation errors.   Levander Slate, MD 10/21/23 1235

## 2023-10-23 ENCOUNTER — Inpatient Hospital Stay: Admitting: Physician Assistant

## 2024-02-26 ENCOUNTER — Encounter: Payer: Self-pay | Admitting: Adult Health

## 2024-02-26 ENCOUNTER — Other Ambulatory Visit: Payer: Self-pay

## 2024-02-26 ENCOUNTER — Ambulatory Visit (INDEPENDENT_AMBULATORY_CARE_PROVIDER_SITE_OTHER): Payer: Self-pay | Admitting: Adult Health

## 2024-02-26 VITALS — BP 117/77 | HR 74 | Temp 98.0°F | Ht 70.0 in | Wt 262.0 lb

## 2024-02-26 DIAGNOSIS — R002 Palpitations: Secondary | ICD-10-CM

## 2024-02-26 NOTE — Progress Notes (Signed)
 Therapist, [facility] [address] [place], [place] [address]   Office Visit Note  Patient Name: Eduardo Edwards Date of Birth [birth year]  Medical Record number [id number]  Date of Service: 02/26/2024  Chief Complaint  Patient presents with   Dizziness    Intermittent dizziness     HPI Pt is here for a sick visit. He reports feeling off.  He describes dizziness, and feeling uneasy.  It has been intermittent since yesterday.  He states his hands will get sweaty when this happens as well.  This feels similar to an episode he had in August and he went to the ED for. He does feel palpitations at times.   Current Medication:  Outpatient Encounter Medications as of 02/26/2024  Medication Sig   [DISCONTINUED] famotidine  (PEPCID ) 20 MG tablet Take 1 tablet (20 mg total) by mouth 2 (two) times daily.   [DISCONTINUED] hydrOXYzine  (ATARAX ) 25 MG tablet Take 1 tablet (25 mg total) by mouth 3 (three) times daily as needed.   [DISCONTINUED] omeprazole  (PRILOSEC  OTC) 20 MG tablet Take 1 tablet (20 mg total) by mouth daily.   [DISCONTINUED] sucralfate  (CARAFATE ) 1 g tablet Take 1 tablet (1 g total) by mouth 4 (four) times daily.   No facility-administered encounter medications on file as of 02/26/2024.      Medical History: History reviewed. No pertinent past medical history.   Vital Signs: BP 117/77   Pulse 74   Temp 98 F (36.7 C)   Ht 5' 10 (1.778 m)   Wt 262 lb (118.8 kg)   SpO2 99%   BMI 37.59 kg/m    Review of Systems  Constitutional:  Positive for diaphoresis.  Cardiovascular:  Positive for palpitations.  Neurological:  Positive for dizziness and light-headedness.    Physical Exam Vitals reviewed.  Constitutional:      Appearance: Normal appearance.  HENT:     Head: Normocephalic.     Right Ear: Tympanic membrane and ear canal normal.     Left Ear: Tympanic membrane and ear canal normal.     Nose: Nose normal.     Mouth/Throat:     Mouth: Mucous membranes are  moist.     Pharynx: No posterior oropharyngeal erythema.  Eyes:     Pupils: Pupils are equal, round, and reactive to light.  Cardiovascular:     Rate and Rhythm: Normal rate.  Pulmonary:     Effort: Pulmonary effort is normal.     Breath sounds: Normal breath sounds.  Lymphadenopathy:     Cervical: No cervical adenopathy.  Neurological:     Mental Status: He is alert.    Assessment/Plan: 1. Palpitations (Primary) Check labs, and discussed seeing cardiology for Holter monitor to rule out rhythm issues.  - Comprehensive metabolic panel with GFR - CBC with Differential/Platelet - B12 and Folate Panel - VITAMIN D  25 Hydroxy (Vit-D Deficiency, Fractures) - TSH - Magnesium  - Phosphorus - Ambulatory referral to Cardiology - HgB A1c - EKG 12-Lead     General Counseling: Nashid verbalizes understanding of the findings of todays visit and agrees with plan of treatment. I have discussed any further diagnostic evaluation that may be needed or ordered today. We also reviewed his medications today. he has been encouraged to call the office with any questions or concerns that should arise related to todays visit.   Orders Placed This Encounter  Procedures   Comprehensive metabolic panel with GFR   CBC with Differential/Platelet   B12 and Folate Panel  VITAMIN D  25 Hydroxy (Vit-D Deficiency, Fractures)   TSH   Magnesium    Phosphorus   HgB A1c   Ambulatory referral to Cardiology    No orders of the defined types were placed in this encounter.   Time spent:15 Minutes    Juliene DOROTHA Howells AGNP-C Nurse Practitioner

## 2024-02-27 LAB — COMPREHENSIVE METABOLIC PANEL WITH GFR
ALT: 18 IU/L (ref 0–44)
AST: 23 IU/L (ref 0–40)
Albumin: 4.1 g/dL (ref 4.1–5.1)
Alkaline Phosphatase: 75 IU/L (ref 47–123)
BUN/Creatinine Ratio: 13 (ref 9–20)
BUN: 12 mg/dL (ref 6–20)
Bilirubin Total: 0.3 mg/dL (ref 0.0–1.2)
CO2: 22 mmol/L (ref 20–29)
Calcium: 9.2 mg/dL (ref 8.7–10.2)
Chloride: 105 mmol/L (ref 96–106)
Creatinine, Ser: 0.96 mg/dL (ref 0.76–1.27)
Globulin, Total: 3 g/dL (ref 1.5–4.5)
Glucose: 100 mg/dL — ABNORMAL HIGH (ref 70–99)
Potassium: 4.3 mmol/L (ref 3.5–5.2)
Sodium: 139 mmol/L (ref 134–144)
Total Protein: 7.1 g/dL (ref 6.0–8.5)
eGFR: 106 mL/min/1.73

## 2024-02-27 LAB — CBC WITH DIFFERENTIAL/PLATELET
Basophils Absolute: 0 x10E3/uL (ref 0.0–0.2)
Basos: 1 %
EOS (ABSOLUTE): 0.1 x10E3/uL (ref 0.0–0.4)
Eos: 1 %
Hematocrit: 40.4 % (ref 37.5–51.0)
Hemoglobin: 13.3 g/dL (ref 13.0–17.7)
Immature Grans (Abs): 0 x10E3/uL (ref 0.0–0.1)
Immature Granulocytes: 0 %
Lymphocytes Absolute: 2.5 x10E3/uL (ref 0.7–3.1)
Lymphs: 44 %
MCH: 28.5 pg (ref 26.6–33.0)
MCHC: 32.9 g/dL (ref 31.5–35.7)
MCV: 87 fL (ref 79–97)
Monocytes Absolute: 0.3 x10E3/uL (ref 0.1–0.9)
Monocytes: 5 %
Neutrophils Absolute: 2.8 x10E3/uL (ref 1.4–7.0)
Neutrophils: 49 %
Platelets: 354 x10E3/uL (ref 150–450)
RBC: 4.66 x10E6/uL (ref 4.14–5.80)
RDW: 13.9 % (ref 11.6–15.4)
WBC: 5.7 x10E3/uL (ref 3.4–10.8)

## 2024-02-27 LAB — HEMOGLOBIN A1C
Est. average glucose Bld gHb Est-mCnc: 128 mg/dL
Hgb A1c MFr Bld: 6.1 % — ABNORMAL HIGH (ref 4.8–5.6)

## 2024-02-27 LAB — B12 AND FOLATE PANEL
Folate: 8.5 ng/mL
Vitamin B-12: 625 pg/mL (ref 232–1245)

## 2024-02-27 LAB — PHOSPHORUS: Phosphorus: 2.4 mg/dL — ABNORMAL LOW (ref 2.8–4.1)

## 2024-02-27 LAB — TSH: TSH: 1.84 u[IU]/mL (ref 0.450–4.500)

## 2024-02-27 LAB — VITAMIN D 25 HYDROXY (VIT D DEFICIENCY, FRACTURES): Vit D, 25-Hydroxy: 12.1 ng/mL — ABNORMAL LOW (ref 30.0–100.0)

## 2024-02-27 LAB — MAGNESIUM: Magnesium: 2.1 mg/dL (ref 1.6–2.3)

## 2024-03-02 ENCOUNTER — Ambulatory Visit: Payer: Self-pay | Admitting: Adult Health

## 2024-03-02 MED ORDER — VITAMIN D (ERGOCALCIFEROL) 1.25 MG (50000 UNIT) PO CAPS: 50000.0000 [IU] | ORAL_CAPSULE | ORAL | 0 refills | Status: DC

## 2024-03-04 ENCOUNTER — Ambulatory Visit

## 2024-03-04 ENCOUNTER — Ambulatory Visit: Attending: Cardiology | Admitting: Cardiology

## 2024-03-04 ENCOUNTER — Encounter: Payer: Self-pay | Admitting: Cardiology

## 2024-03-04 VITALS — BP 108/80 | HR 68 | Ht 70.0 in | Wt 273.2 lb

## 2024-03-04 DIAGNOSIS — R002 Palpitations: Secondary | ICD-10-CM

## 2024-03-04 DIAGNOSIS — Z6839 Body mass index (BMI) 39.0-39.9, adult: Secondary | ICD-10-CM

## 2024-03-04 NOTE — Patient Instructions (Signed)
 Medication Instructions:  No changes *If you need a refill on your cardiac medications before your next appointment, please call your pharmacy*  Lab Work: None ordered If you have labs (blood work) drawn today and your tests are completely normal, you will receive your results only by: MyChart Message (if you have MyChart) OR A paper copy in the mail If you have any lab test that is abnormal or we need to change your treatment, we will call you to review the results.  Follow-Up: At [facility], you and your health needs are our priority.  As part of our continuing mission to provide you with exceptional heart care, our providers are all part of one team.  This team includes your primary Cardiologist (physician) and Advanced Practice Providers or APPs (Physician Assistants and Nurse Practitioners) who all work together to provide you with the care you need, when you need it.  Your next appointment:   2 month(s)  Provider:   You may see Dr. Darliss or one of the following Advanced Practice Providers on your designated Care Team:   Lonni Meager, NP Lesley Maffucci, PA-C Bernardino Bring, PA-C Cadence Solon, PA-C Tylene Lunch, NP Barnie Hila, NP    We recommend signing up for the patient portal called MyChart.  Sign up information is provided on this After Visit Summary.  MyChart is used to connect with patients for Virtual Visits (Telemedicine).  Patients are able to view lab/test results, encounter notes, upcoming appointments, etc.  Non-urgent messages can be sent to your provider as well.   To learn more about what you can do with MyChart, go to [email or web address].   Other Instructions ZIO XT- Long Term Monitor Instructions  Your physician has requested you wear a ZIO patch monitor for 14 days.  This is a single patch monitor. [employer] supplies one patch monitor per enrollment. Additional stickers are not available. Please do not apply patch if you will be  having a Nuclear Stress Test, Echocardiogram, Cardiac CT, MRI, or Chest Xray during the period you would be wearing the monitor. The patch cannot be worn during these tests. You cannot remove and re-apply the ZIO XT patch monitor.  Your ZIO patch monitor will be mailed 3 day USPS to your address on file. It may take 3-5 days to receive your monitor after you have been enrolled. Once you have received your monitor, please review the enclosed instructions. Your monitor has already been registered assigning a specific monitor serial number to you.  Billing and Patient Assistance Program Information  We have supplied [employer] with any of your insurance information on file for billing purposes.  [employer] offers a sliding scale Patient Assistance Program for patients that do not have insurance, or whose insurance does not completely cover the cost of the ZIO monitor.  You must apply for the Patient Assistance Program to qualify for this discounted rate.  To apply, please call [employer] at [phone], select option 4, select option 2, ask to apply for Patient Assistance Program. Meredeth will ask your household income, and how many people are in your household. They will quote your out-of-pocket cost based on that information. [employer] will also be able to set up a 93-month, interest-free payment plan if needed.  Applying the monitor   Shave hair from upper left chest.  Hold abrader disc by orange tab. Rub abrader in 40 strokes over the upper left chest as indicated in your monitor instructions.  Clean area with 4 enclosed alcohol pads. Let dry.  Apply patch as indicated in monitor instructions. Patch will be placed under collarbone on left side of chest with arrow pointing upward.  Rub patch adhesive wings for 2 minutes. Remove white label marked 1. Remove the white label marked 2. Rub patch adhesive wings for 2 additional minutes.  While looking in a mirror, press and release button in center of patch. A  small green light will flash 3-4 times. This will be your only indicator that the monitor has been turned on.  Do not shower for the first 24 hours. You may shower after the first 24 hours.  Press the button if you feel a symptom. You will hear a small click. Record Date, Time and Symptom in the Patient Logbook.  When you are ready to remove the patch, follow instructions on the last 2 pages of Patient Logbook.  Stick patch monitor into the tabs at the bottom of the return box.  Place Patient Logbook in the blue and white box. Use locking tab on box and tape box closed securely. The blue and white box has prepaid postage on it. Please place it in the mailbox as soon as possible. Your physician should have your test results approximately 7-14 days after the monitor has been mailed back to [facility].  Call [facility] Customer Care at [phone] if you have questions regarding your ZIO XT patch monitor.  Call them immediately if you see an orange light blinking on your monitor.  If your monitor falls off in less than 4 days, contact our Monitor department at [phone].  If your monitor becomes loose or falls off after 4 days call [employer] at [phone] for suggestions on securing your monitor.

## 2024-03-04 NOTE — Progress Notes (Signed)
 " Cardiology Office Note:    Date:  03/04/2024   ID:  Eduardo Edwards, DOB Jan 01, 1991, MRN [id number]  PCP:  Dineen Channel, PA-C   [facility] Providers Cardiologist:  None     Referring MD: Leonce Juliene PARAS, NP   Chief Complaint  Patient presents with   New Patient (Initial Visit)    Ref by Juliene Leonce, NP for palpitations. Patient c/o palpitations and feeling anxious with occasional shortness of breath & chest discomfort.    Eduardo Edwards is a 33 y.o. male who is being seen today for the evaluation of palpitations at the request of Leonce, Juliene PARAS, NP.  History of Present Illness:    Khaidyn Edwards is a 33 y.o. male with no significant past medical history who presents due to palpitations.  Endorses palpitations for about a year now, worsening over the past 6 months occurring 3 days weekly.  Symptoms usually last anywhere from 30 minutes to an hour.  He denies dizziness, presyncope or syncope.  Denies any immediate family history of heart disease.  Denies chest pain or shortness of breath.    History reviewed. No pertinent past medical history.  Past Surgical History:  Procedure Laterality Date   NO PAST SURGERIES      Current Medications: Active Medications[1]   Allergies:   Patient has no known allergies.   Social History   Socioeconomic History   Marital status: Single    Spouse name: Not on file   Number of children: Not on file   Years of education: Not on file   Highest education level: Not on file  Occupational History   Not on file  Tobacco Use   Smoking status: Never   Smokeless tobacco: Never  Vaping Use   Vaping status: Former   Quit date: 11/20/2019  Substance and Sexual Activity   Alcohol use: Not Currently    Comment: rare   Drug use: No   Sexual activity: Yes  Other Topics Concern   Not on file  Social History Narrative   Not on file   Social Drivers of Health   Tobacco Use: Low Risk (03/04/2024)   Patient History    Smoking Tobacco  Use: Never    Smokeless Tobacco Use: Never    Passive Exposure: Not on file  Financial Resource Strain: Not on file  Food Insecurity: Not on file  Transportation Needs: Not on file  Physical Activity: Not on file  Stress: Not on file  Social Connections: Not on file  Depression (PHQ2-9): Medium Risk (02/24/2023)   Depression (PHQ2-9)    PHQ-2 Score: 9  Alcohol Screen: Not on file  Housing: Not on file  Utilities: Not on file  Health Literacy: Not on file     Family History: The patient's family history includes Bone cancer in his father; Breast cancer in his paternal grandmother; Cirrhosis in his father; Gout in his father; Healthy in his mother; Hypertension in his father; Prostate cancer in his father.  ROS:   Please see the history of present illness.     All other systems reviewed and are negative.  EKGs/Labs/Other Studies Reviewed:    The following studies were reviewed today:  EKG Interpretation Date/Time:  Friday March 04 2024 09:26:00 EST Ventricular Rate:  68 PR Interval:  152 QRS Duration:  98 QT Interval:  378 QTC Calculation: 401 R Axis:   43  Text Interpretation: Normal sinus rhythm Normal ECG Confirmed by Darliss Rogue ([id number]) on 03/04/2024 9:33:06 AM  Recent Labs: 02/26/2024: ALT 18; BUN 12; Creatinine, Ser 0.96; Hemoglobin 13.3; Magnesium  2.1; Platelets 354; Potassium 4.3; Sodium 139; TSH 1.840  Recent Lipid Panel    Component Value Date/Time   CHOL 143 02/24/2023 1451   TRIG 169 (H) 02/24/2023 1451   HDL 24 (L) 02/24/2023 1451   CHOLHDL 6.0 (H) 02/24/2023 1451   LDLCALC 89 02/24/2023 1451     Risk Assessment/Calculations:             Physical Exam:    VS:  BP 108/80 (BP Location: Right Arm, Patient Position: Sitting, Cuff Size: Large)   Pulse 68   Ht 5' 10 (1.778 m)   Wt 273 lb 4 oz (123.9 kg)   SpO2 98%   BMI 39.21 kg/m     Wt Readings from Last 3 Encounters:  03/04/24 273 lb 4 oz (123.9 kg)  02/26/24 262 lb (118.8 kg)   10/21/23 265 lb 14 oz (120.6 kg)     GEN:  Well nourished, well developed in no acute distress HEENT: Normal NECK: No JVD; No carotid bruits CARDIAC: RRR, no murmurs, rubs, gallops RESPIRATORY:  Clear to auscultation without rales, wheezing or rhonchi  ABDOMEN: Soft, non-tender, non-distended MUSCULOSKELETAL:  No edema; No deformity  SKIN: Warm and dry NEUROLOGIC:  Alert and oriented x 3 PSYCHIATRIC:  Normal affect   ASSESSMENT:    1. Palpitations   2. BMI 39.0-39.9,adult    PLAN:    In order of problems listed above:  Palpitations, ordered 2-week cardiac monitor to evaluate any significant arrhythmias. Obesity, low-calorie diet, weight loss emphasized.  Follow-up after cardiac testing     Medication Adjustments/Labs and Tests Ordered: Current medicines are reviewed at length with the patient today.  Concerns regarding medicines are outlined above.  Orders Placed This Encounter  Procedures   LONG TERM MONITOR (3-14 DAYS)   EKG 12-Lead   No orders of the defined types were placed in this encounter.   Patient Instructions  Medication Instructions:  No changes *If you need a refill on your cardiac medications before your next appointment, please call your pharmacy*  Lab Work: None ordered If you have labs (blood work) drawn today and your tests are completely normal, you will receive your results only by: MyChart Message (if you have MyChart) OR A paper copy in the mail If you have any lab test that is abnormal or we need to change your treatment, we will call you to review the results.   Follow-Up: At [facility] you and your health needs are our priority.  As part of our continuing mission to provide you with exceptional heart care, our providers are all part of one team.  This team includes your primary Cardiologist (physician) and Advanced Practice Providers or APPs (Physician Assistants and Nurse Practitioners) who all work together to provide you  with the care you need, when you need it.  Your next appointment:   2 month(s)  Provider:   You may see Dr. Darliss or one of the following Advanced Practice Providers on your designated Care Team:   Lonni Meager, NP Lesley Maffucci, PA-C Bernardino Bring, PA-C Cadence Malakoff, PA-C Tylene Lunch, NP Barnie Hila, NP    We recommend signing up for the patient portal called MyChart.  Sign up information is provided on this After Visit Summary.  MyChart is used to connect with patients for Virtual Visits (Telemedicine).  Patients are able to view lab/test results, encounter notes, upcoming appointments, etc.  Non-urgent messages can  be sent to your provider as well.   To learn more about what you can do with MyChart, go to [email or web address].   Other Instructions ZIO XT- Long Term Monitor Instructions  Your physician has requested you wear a ZIO patch monitor for 14 days.  This is a single patch monitor. [employer] supplies one patch monitor per enrollment. Additional stickers are not available. Please do not apply patch if you will be having a Nuclear Stress Test, Echocardiogram, Cardiac CT, MRI, or Chest Xray during the period you would be wearing the monitor. The patch cannot be worn during these tests. You cannot remove and re-apply the ZIO XT patch monitor.  Your ZIO patch monitor will be mailed 3 day USPS to your address on file. It may take 3-5 days to receive your monitor after you have been enrolled. Once you have received your monitor, please review the enclosed instructions. Your monitor has already been registered assigning a specific monitor serial number to you.  Billing and Patient Assistance Program Information  We have supplied [employer] with any of your insurance information on file for billing purposes.  [employer] offers a sliding scale Patient Assistance Program for patients that do not have insurance, or whose insurance does not completely cover the cost of the ZIO  monitor.  You must apply for the Patient Assistance Program to qualify for this discounted rate.  To apply, please call [employer] at [phone], select option 4, select option 2, ask to apply for Patient Assistance Program. Meredeth will ask your household income, and how many people are in your household. They will quote your out-of-pocket cost based on that information. [employer] will also be able to set up a 33-month, interest-free payment plan if needed.  Applying the monitor   Shave hair from upper left chest.  Hold abrader disc by orange tab. Rub abrader in 40 strokes over the upper left chest as indicated in your monitor instructions.  Clean area with 4 enclosed alcohol pads. Let dry.  Apply patch as indicated in monitor instructions. Patch will be placed under collarbone on left side of chest with arrow pointing upward.  Rub patch adhesive wings for 2 minutes. Remove white label marked 1. Remove the white label marked 2. Rub patch adhesive wings for 2 additional minutes.  While looking in a mirror, press and release button in center of patch. A small green light will flash 3-4 times. This will be your only indicator that the monitor has been turned on.  Do not shower for the first 24 hours. You may shower after the first 24 hours.  Press the button if you feel a symptom. You will hear a small click. Record Date, Time and Symptom in the Patient Logbook.  When you are ready to remove the patch, follow instructions on the last 2 pages of Patient Logbook.  Stick patch monitor into the tabs at the bottom of the return box.  Place Patient Logbook in the blue and white box. Use locking tab on box and tape box closed securely. The blue and white box has prepaid postage on it. Please place it in the mailbox as soon as possible. Your physician should have your test results approximately 7-14 days after the monitor has been mailed back to [facility].  Call [facility] Customer Care at  [phone] if you have questions regarding your ZIO XT patch monitor.  Call them immediately if you see an orange light blinking on your monitor.  If your monitor falls off in less  than 4 days, contact our Monitor department at [phone].  If your monitor becomes loose or falls off after 4 days call [employer] at [phone] for suggestions on securing your monitor.            Signed, Redell Cave, MD  03/04/2024 10:12 AM    [facility]    [1]  No outpatient medications have been marked as taking for the 03/04/24 encounter (Office Visit) with Cave Redell, MD.   "

## 2024-03-09 ENCOUNTER — Encounter: Payer: Self-pay | Admitting: Medical

## 2024-03-09 ENCOUNTER — Encounter: Payer: Self-pay | Admitting: Cardiology

## 2024-03-09 ENCOUNTER — Ambulatory Visit: Admitting: Medical

## 2024-03-09 ENCOUNTER — Other Ambulatory Visit: Payer: Self-pay

## 2024-03-09 VITALS — BP 110/88 | HR 82 | Temp 97.5°F | Ht 70.0 in

## 2024-03-09 DIAGNOSIS — S20329A Blister (nonthermal) of unspecified front wall of thorax, initial encounter: Secondary | ICD-10-CM

## 2024-03-09 NOTE — Progress Notes (Signed)
 [facility] [address] [place], [place] [address]   Office Visit Note  Patient Name: Eduardo Edwards Date of Birth [birth year]  Medical Record number [id number]  Date of Service: 03/09/2024  Chief Complaint  Patient presents with   Acute Visit    Patient applied cardiac monitor to his chest 2 days ago and is concerned about a possible blood blister beneath the adhesive.     HPI 33 y.o. male presents with itching and blister under heart monitor.  Applied at-home heart monitor (ordered by cardiologist) to himself on evening of 12/29. Began to note itching under adhesive yesterday afternoon. Has been rubbing area. Noted purple bubble under adhesive yesterday evening. Continues to have some itching. No redness or itching elsewhere to [name] area. No hx of adhesive allergy. Thinks purple bubble has gotten slightly larger since yesterday.  Has not experienced any palpitations since application of monitor. Feels otherwise well. No difficulty breathing or swallowing. Has not contacted cardiologist regarding the issue.   Current Medication:  Outpatient Encounter Medications as of 03/09/2024  Medication Sig   [DISCONTINUED] famotidine  (PEPCID ) 20 MG tablet Take 1 tablet (20 mg total) by mouth 2 (two) times daily.   [DISCONTINUED] omeprazole  (PRILOSEC  OTC) 20 MG tablet Take 1 tablet (20 mg total) by mouth daily.   [DISCONTINUED] sucralfate  (CARAFATE ) 1 g tablet Take 1 tablet (1 g total) by mouth 4 (four) times daily.   No facility-administered encounter medications on file as of 03/09/2024.      Medical History: History reviewed. No pertinent past medical history.   Vital Signs: BP 110/88   Pulse 82   Temp (!) 97.5 F (36.4 C)   Ht 5' 10 (1.778 m)   SpO2 99%   BMI 39.21 kg/m    Review of Systems  Constitutional:  Negative for chills and fever.  HENT:  Negative for facial swelling and trouble swallowing.   Respiratory:  Negative for shortness  of breath.   Cardiovascular:  Negative for chest pain and palpitations.  Skin:        Skin lesion (blood blister) under adhesive to left upper chest. Some itching to same area.    Physical Exam Vitals reviewed.  Constitutional:      General: He is not in acute distress.    Appearance: He is not ill-appearing.  Skin:    Comments: Heart monitor well-adhered to left upper chest. ~1 cm soft reddish-purple vesicle underlying medial portion of adhesive. No other rash, redness or swelling underlying adhesive/monitor.  Neurological:     Mental Status: He is alert.     Assessment/Plan: 1. Traumatic blister of chest wall, initial encounter (Primary) Suspect blister with small hematoma secondary to rubbing area under adhesive. No evidence of allergy to monitor/adhesive. Discussed trying to keep monitor on if possible for at least 72 hours (minimum time advised by cardiologist). Given holiday tomorrow, discussed returning for follow up in 2 days for assistance removing monitor if needed (if blister/hematoma continues to expand) or itching is intolerable.  Advised to avoid further rubbing of area to limit expansion of hematoma/blister. May try OTC antihistamine.  Showed patient how to attach photo to MyChart message if he wants to send photo to cardiologist for their advice.  General Counseling: Eduardo Edwards understanding of the findings of todays visit and plan of treatment. he has been encouraged to call the office with any questions or concerns that should arise related to todays visit.    Time spent:20 Minutes  Joen Arts PA-C Physician Assistant

## 2024-03-15 NOTE — Patient Instructions (Signed)
-  Avoid rubbing/scratching your chest or the area where the monitor is attached, as this may cause the blister to become larger. -Try taking an over-the-counter antihistamine (i.e. Claritin, Zyrtec) as needed for itching. -I recommend sending a photo of the blister to your cardiologist's office through MyChart to make them aware. -Try to leave the monitor attached for at least 72 hours (3 days). If the blister gets larger or is painful or the itching worsens, consider scheduling a follow up visit, so we can help you remove it.

## 2024-04-05 ENCOUNTER — Ambulatory Visit: Payer: Self-pay | Admitting: Cardiology

## 2024-04-05 DIAGNOSIS — R002 Palpitations: Secondary | ICD-10-CM | POA: Diagnosis not present

## 2024-05-06 ENCOUNTER — Ambulatory Visit: Admitting: Cardiology
